# Patient Record
Sex: Female | Born: 1950 | Race: White | Hispanic: No | Marital: Married | Smoking: Former smoker
Health system: Southern US, Community
[De-identification: ages and names within clinical notes are randomized; demographics above are authoritative.]

## PROBLEM LIST (undated history)

## (undated) DIAGNOSIS — R42 Dizziness and giddiness: Secondary | ICD-10-CM

## (undated) DIAGNOSIS — M1990 Unspecified osteoarthritis, unspecified site: Secondary | ICD-10-CM

## (undated) DIAGNOSIS — R0600 Dyspnea, unspecified: Secondary | ICD-10-CM

## (undated) DIAGNOSIS — R0602 Shortness of breath: Secondary | ICD-10-CM

## (undated) DIAGNOSIS — G5601 Carpal tunnel syndrome, right upper limb: Secondary | ICD-10-CM

## (undated) DIAGNOSIS — R51 Headache: Secondary | ICD-10-CM

## (undated) DIAGNOSIS — I499 Cardiac arrhythmia, unspecified: Secondary | ICD-10-CM

## (undated) DIAGNOSIS — R011 Cardiac murmur, unspecified: Secondary | ICD-10-CM

## (undated) DIAGNOSIS — T4145XA Adverse effect of unspecified anesthetic, initial encounter: Secondary | ICD-10-CM

## (undated) DIAGNOSIS — R002 Palpitations: Secondary | ICD-10-CM

## (undated) DIAGNOSIS — T8859XA Other complications of anesthesia, initial encounter: Secondary | ICD-10-CM

## (undated) DIAGNOSIS — K759 Inflammatory liver disease, unspecified: Secondary | ICD-10-CM

## (undated) HISTORY — DX: Palpitations: R00.2

## (undated) HISTORY — DX: Shortness of breath: R06.02

## (undated) HISTORY — DX: Dizziness and giddiness: R42

## (undated) HISTORY — DX: Dyspnea, unspecified: R06.00

## (undated) HISTORY — PX: COLONOSCOPY: SHX174

## (undated) HISTORY — PX: ABDOMINAL HYSTERECTOMY: SHX81

## (undated) HISTORY — DX: Cardiac murmur, unspecified: R01.1

## (undated) HISTORY — PX: NASAL SINUS SURGERY: SHX719

## (undated) HISTORY — PX: PARTIAL HYSTERECTOMY: SHX80

## (undated) HISTORY — PX: BACK SURGERY: SHX140

---

## 1974-12-11 DIAGNOSIS — K759 Inflammatory liver disease, unspecified: Secondary | ICD-10-CM

## 1974-12-11 HISTORY — DX: Inflammatory liver disease, unspecified: K75.9

## 1998-03-11 ENCOUNTER — Other Ambulatory Visit: Admission: RE | Admit: 1998-03-11 | Discharge: 1998-03-11 | Payer: Self-pay | Admitting: *Deleted

## 1998-07-07 ENCOUNTER — Other Ambulatory Visit: Admission: RE | Admit: 1998-07-07 | Discharge: 1998-07-07 | Payer: Self-pay | Admitting: *Deleted

## 1998-10-19 ENCOUNTER — Emergency Department (HOSPITAL_COMMUNITY): Admission: EM | Admit: 1998-10-19 | Discharge: 1998-10-19 | Payer: Self-pay | Admitting: Emergency Medicine

## 1999-05-12 ENCOUNTER — Other Ambulatory Visit: Admission: RE | Admit: 1999-05-12 | Discharge: 1999-05-12 | Payer: Self-pay | Admitting: *Deleted

## 1999-07-14 ENCOUNTER — Encounter (INDEPENDENT_AMBULATORY_CARE_PROVIDER_SITE_OTHER): Payer: Self-pay | Admitting: Specialist

## 1999-07-14 ENCOUNTER — Other Ambulatory Visit: Admission: RE | Admit: 1999-07-14 | Discharge: 1999-07-14 | Payer: Self-pay | Admitting: *Deleted

## 2001-01-28 ENCOUNTER — Other Ambulatory Visit: Admission: RE | Admit: 2001-01-28 | Discharge: 2001-01-28 | Payer: Self-pay | Admitting: *Deleted

## 2001-02-25 ENCOUNTER — Encounter: Payer: Self-pay | Admitting: Orthopedic Surgery

## 2001-02-25 ENCOUNTER — Ambulatory Visit (HOSPITAL_COMMUNITY): Admission: RE | Admit: 2001-02-25 | Discharge: 2001-02-25 | Payer: Self-pay | Admitting: Orthopedic Surgery

## 2001-03-23 ENCOUNTER — Inpatient Hospital Stay (HOSPITAL_COMMUNITY): Admission: EM | Admit: 2001-03-23 | Discharge: 2001-03-25 | Payer: Self-pay | Admitting: *Deleted

## 2002-08-03 ENCOUNTER — Emergency Department (HOSPITAL_COMMUNITY): Admission: EM | Admit: 2002-08-03 | Discharge: 2002-08-03 | Payer: Self-pay | Admitting: Emergency Medicine

## 2002-08-12 ENCOUNTER — Encounter: Payer: Self-pay | Admitting: Neurosurgery

## 2002-08-13 ENCOUNTER — Encounter: Payer: Self-pay | Admitting: Neurosurgery

## 2002-08-13 ENCOUNTER — Ambulatory Visit (HOSPITAL_COMMUNITY): Admission: RE | Admit: 2002-08-13 | Discharge: 2002-08-14 | Payer: Self-pay | Admitting: Neurosurgery

## 2002-12-22 ENCOUNTER — Encounter: Admission: RE | Admit: 2002-12-22 | Discharge: 2003-03-22 | Payer: Self-pay

## 2003-03-19 ENCOUNTER — Encounter: Admission: RE | Admit: 2003-03-19 | Discharge: 2003-06-17 | Payer: Self-pay

## 2005-06-25 ENCOUNTER — Emergency Department (HOSPITAL_COMMUNITY): Admission: EM | Admit: 2005-06-25 | Discharge: 2005-06-25 | Payer: Self-pay | Admitting: *Deleted

## 2005-08-10 ENCOUNTER — Ambulatory Visit (HOSPITAL_COMMUNITY): Admission: RE | Admit: 2005-08-10 | Discharge: 2005-08-10 | Payer: Self-pay | Admitting: Internal Medicine

## 2006-03-02 ENCOUNTER — Encounter
Admission: RE | Admit: 2006-03-02 | Discharge: 2006-03-23 | Payer: Self-pay | Admitting: Physical Medicine and Rehabilitation

## 2006-12-11 HISTORY — PX: ELBOW SURGERY: SHX618

## 2007-01-09 ENCOUNTER — Encounter: Admission: RE | Admit: 2007-01-09 | Discharge: 2007-01-09 | Payer: Self-pay | Admitting: Orthopedic Surgery

## 2007-03-20 ENCOUNTER — Emergency Department (HOSPITAL_COMMUNITY): Admission: EM | Admit: 2007-03-20 | Discharge: 2007-03-20 | Payer: Self-pay | Admitting: Emergency Medicine

## 2007-03-26 ENCOUNTER — Ambulatory Visit: Payer: Self-pay | Admitting: Internal Medicine

## 2008-01-04 ENCOUNTER — Encounter: Admission: RE | Admit: 2008-01-04 | Discharge: 2008-01-04 | Payer: Self-pay | Admitting: Orthopedic Surgery

## 2009-03-30 ENCOUNTER — Encounter: Admission: RE | Admit: 2009-03-30 | Discharge: 2009-03-30 | Payer: Self-pay | Admitting: Orthopedic Surgery

## 2009-08-23 ENCOUNTER — Encounter: Admission: RE | Admit: 2009-08-23 | Discharge: 2009-08-23 | Payer: Self-pay | Admitting: Cardiology

## 2009-10-26 ENCOUNTER — Encounter: Admission: RE | Admit: 2009-10-26 | Discharge: 2009-10-26 | Payer: Self-pay | Admitting: Gastroenterology

## 2010-02-02 ENCOUNTER — Encounter: Admission: RE | Admit: 2010-02-02 | Discharge: 2010-02-02 | Payer: Self-pay | Admitting: Orthopedic Surgery

## 2010-05-31 ENCOUNTER — Encounter: Admission: RE | Admit: 2010-05-31 | Discharge: 2010-05-31 | Payer: Self-pay | Admitting: Gastroenterology

## 2011-01-01 ENCOUNTER — Encounter: Payer: Self-pay | Admitting: Internal Medicine

## 2011-01-01 ENCOUNTER — Encounter: Payer: Self-pay | Admitting: Orthopedic Surgery

## 2011-01-02 ENCOUNTER — Encounter: Payer: Self-pay | Admitting: Orthopedic Surgery

## 2011-01-27 ENCOUNTER — Other Ambulatory Visit: Payer: Self-pay | Admitting: Family Medicine

## 2011-01-27 DIAGNOSIS — M778 Other enthesopathies, not elsewhere classified: Secondary | ICD-10-CM

## 2011-02-03 ENCOUNTER — Ambulatory Visit
Admission: RE | Admit: 2011-02-03 | Discharge: 2011-02-03 | Disposition: A | Discharge status: Home / Self Care | Payer: Medicare Other | Source: Ambulatory Visit | Attending: Family Medicine | Admitting: Family Medicine

## 2011-02-03 DIAGNOSIS — M778 Other enthesopathies, not elsewhere classified: Secondary | ICD-10-CM

## 2011-03-06 ENCOUNTER — Other Ambulatory Visit: Payer: Self-pay | Admitting: *Deleted

## 2011-03-06 DIAGNOSIS — M542 Cervicalgia: Secondary | ICD-10-CM

## 2011-03-07 ENCOUNTER — Other Ambulatory Visit: Payer: Self-pay | Admitting: *Deleted

## 2011-03-07 DIAGNOSIS — M542 Cervicalgia: Secondary | ICD-10-CM

## 2011-03-09 ENCOUNTER — Ambulatory Visit
Admission: RE | Admit: 2011-03-09 | Discharge: 2011-03-09 | Disposition: A | Discharge status: Home / Self Care | Payer: Medicare Other | Source: Ambulatory Visit | Attending: *Deleted | Admitting: *Deleted

## 2011-03-09 DIAGNOSIS — M542 Cervicalgia: Secondary | ICD-10-CM

## 2011-03-24 ENCOUNTER — Encounter: Payer: Self-pay | Admitting: Cardiology

## 2011-03-24 DIAGNOSIS — R011 Cardiac murmur, unspecified: Secondary | ICD-10-CM | POA: Insufficient documentation

## 2011-03-24 DIAGNOSIS — R42 Dizziness and giddiness: Secondary | ICD-10-CM | POA: Insufficient documentation

## 2011-03-24 DIAGNOSIS — R002 Palpitations: Secondary | ICD-10-CM | POA: Insufficient documentation

## 2011-03-24 DIAGNOSIS — R0600 Dyspnea, unspecified: Secondary | ICD-10-CM | POA: Insufficient documentation

## 2011-03-24 DIAGNOSIS — R079 Chest pain, unspecified: Secondary | ICD-10-CM | POA: Insufficient documentation

## 2011-03-27 ENCOUNTER — Ambulatory Visit (INDEPENDENT_AMBULATORY_CARE_PROVIDER_SITE_OTHER): Payer: Medicare Other | Admitting: Cardiology

## 2011-03-27 ENCOUNTER — Encounter: Payer: Self-pay | Admitting: Cardiology

## 2011-03-27 VITALS — BP 130/80 | HR 66 | Wt 161.0 lb

## 2011-03-27 DIAGNOSIS — I499 Cardiac arrhythmia, unspecified: Secondary | ICD-10-CM

## 2011-03-27 DIAGNOSIS — R002 Palpitations: Secondary | ICD-10-CM

## 2011-03-27 DIAGNOSIS — R011 Cardiac murmur, unspecified: Secondary | ICD-10-CM

## 2011-03-27 MED ORDER — METOPROLOL TARTRATE 25 MG PO TABS: 25.0000 mg | ORAL_TABLET | ORAL | Status: DC | PRN

## 2011-03-27 NOTE — Assessment & Plan Note (Signed)
This patient has a history of a heart murmur.  Her last echocardiogram on 08/25/09 showed mild aortic stenosis and no aortic insufficiency.

## 2011-03-27 NOTE — Progress Notes (Signed)
[name] Date of Birth:  Sep 08, 1951 [facility] / [facility] [address]   [address] [place], [place]  [address] [phone]           Fax   [phone]  History of Present Illness: This pleasant 60 year old woman is seen for a work in office visit.  We had not seen her since September 2010.  She comes in because of problems withPalpitations mainly at night.  Worse when she lies on her left side.  He had one episode week ago which awakened her from sleep and lasted about 30 minutes before subsiding he has not been as was any chest pain or shortness of breath she does not have any history of ischemic heart disease and she had a normal stress Cardiolite in 2005.  She has a known heart murmur and her echocardiogram on 08/25/09 and showed mild aortic stenosis.  Current Outpatient Prescriptions  Medication Sig Dispense Refill  . butalbital-acetaminophen-caffeine (FIORICET, ESGIC) 50-325-40 MG per tablet Take 1 tablet by mouth as needed.        . Hormone Gel Base (HRT BASE, MEN,) GEL by Does not apply route. As directed       . TRAMADOL HCL PO Take by mouth as needed.        Marland Kitchen aspirin 81 MG tablet Take 81 mg by mouth daily.        . metoprolol tartrate (LOPRESSOR) 25 MG tablet Take 1 tablet (25 mg total) by mouth as needed (prn palpitations).  30 tablet  11    No Known Allergies  Patient Active Problem List  Diagnoses  . Heart murmur  . Dyspnea  . SOB (shortness of breath)  . Lightheadedness  . Chest pain  . Palpitation    History  Smoking status  . Former Smoker  . Quit date: 04/10/1978  Smokeless tobacco  . Not on file    History  Alcohol Use No    Family History  Problem Relation Age of Onset  . Kidney failure Mother   . Cancer Father     Review of Systems: Constitutional: no fever chills diaphoresis or fatigue or change in weight.  Head and neck: no hearing loss, no epistaxis, no photophobia or visual disturbance. Respiratory: No cough,  shortness of breath or wheezing. Cardiovascular: No chest pain peripheral edema,  Gastrointestinal: No abdominal distention, no abdominal pain, no change in bowel habits hematochezia or melena. Genitourinary: No dysuria, no frequency, no urgency, no nocturia. Musculoskeletal:No arthralgias, no back pain, no gait disturbance or myalgias.She has had recent problems with tendinitis of her elbows as well as problems with cervical spine arthritis. Neurological: No dizziness, no headaches, no numbness, no seizures, no syncope, no weakness, no tremors. Hematologic: No lymphadenopathy, no easy bruising. Psychiatric: No confusion, no hallucinations, no sleep disturbance.    Physical Exam: Filed Vitals:   03/27/11 1503  BP: 130/80  Pulse: 66  Weight 161, down 4 pounds.  The general appearance reveals a well-developed well-nourished woman in no distress.Pupils equal and reactive.   Extraocular Movements are full.  There is no scleral icterus.  The mouth and pharynx are normal.  The neck is supple.  The carotids reveal no bruits.  The jugular venous pressure is normal.  The thyroid is not enlarged.  There is no lymphadenopathy.The chest is clear to percussion and auscultation. There are no rales or rhonchi. Expansion of the chest is symmetrical.The precordium is quiet.  The first heart sound is normal.  The second  heart sound is physiologically split.  There is no  gallop rub or click.There is a grade 2/6 systolic ejection murmur at the base which is present supine sitting and standing.  There is no abnormal lift or heave.The abdomen is soft and nontender. Bowel sounds are normal. The liver and spleen are not enlarged. There Are no abdominal masses. There are no bruits.Normal extremity no phlebitis or edema.  Pedal pulses are good.The skin is warm and dry.  There is no rash.Strength is normal and symmetrical in all extremities.  There is no lateralizing weakness.  There are no sensory deficits.   Assessment /  Plan: Her EKG today is within normal limits.  We are going to have a two-dimensional echocardiogram to evaluate her heart murmur further.  The murmur appears to be more intense than on the last visit.  We're also prescribing Lopressor 25 mg to have on hand for p.r.n. Use for palpitations.  We did not make a return appointment but we will see her p.r.n. And we will be in contact after the echo Is done.

## 2011-03-27 NOTE — Assessment & Plan Note (Signed)
The patient comes in today for evaluation of what she describes as an irregular heartbeat.  This often occurs at night.  She had one episode week ago where she awoke in with a rapid heartbeat which lasted About 30 minutes and she was on the verge of going to the emergency room when it slowed down.  She has difficulty lying on her left side because of dyspnea and increased palpitations.  He has difficulty lying on her right side because of a painful left hip condition.  She does not have any history of ischemic heart disease and she had a normal stress Cardiolite on 03/24/04.  She denies any recent chest pain or pressure.

## 2011-04-03 ENCOUNTER — Ambulatory Visit (HOSPITAL_COMMUNITY): Payer: Medicare Other | Attending: Cardiology

## 2011-04-03 DIAGNOSIS — I319 Disease of pericardium, unspecified: Secondary | ICD-10-CM | POA: Insufficient documentation

## 2011-04-03 DIAGNOSIS — I079 Rheumatic tricuspid valve disease, unspecified: Secondary | ICD-10-CM | POA: Insufficient documentation

## 2011-04-03 DIAGNOSIS — R0989 Other specified symptoms and signs involving the circulatory and respiratory systems: Secondary | ICD-10-CM | POA: Insufficient documentation

## 2011-04-03 DIAGNOSIS — I059 Rheumatic mitral valve disease, unspecified: Secondary | ICD-10-CM | POA: Insufficient documentation

## 2011-04-03 DIAGNOSIS — I379 Nonrheumatic pulmonary valve disorder, unspecified: Secondary | ICD-10-CM | POA: Insufficient documentation

## 2011-04-03 DIAGNOSIS — R0609 Other forms of dyspnea: Secondary | ICD-10-CM | POA: Insufficient documentation

## 2011-04-03 DIAGNOSIS — R002 Palpitations: Secondary | ICD-10-CM

## 2011-04-03 DIAGNOSIS — I499 Cardiac arrhythmia, unspecified: Secondary | ICD-10-CM

## 2011-04-03 DIAGNOSIS — R42 Dizziness and giddiness: Secondary | ICD-10-CM | POA: Insufficient documentation

## 2011-04-05 ENCOUNTER — Telehealth: Payer: Self-pay | Admitting: *Deleted

## 2011-04-05 NOTE — Progress Notes (Signed)
Advised patient of echo  

## 2011-04-05 NOTE — Telephone Encounter (Signed)
Advised patient of echo.  Patient asked about her increased heart rate that had been discussed at office visit.  Asked her if she avoided caffeine and she stated she did not.  Stated she had most problems with increased heart rate at bedtime and that she takes a medication with caffeine in it at bedtime.  Stated she couldn't deal with the headaches, but she could deal with the increased heart rate.  Stated she would just use the medication Dr. Patty Sermons gave her and call back if she needed anything

## 2011-04-05 NOTE — Telephone Encounter (Signed)
Message copied by Regis Bill on Wed Apr 05, 2011 11:21 AM ------      Message from: Cassell Clement      Created: Tue Apr 04, 2011 10:44 AM       Pls report.  Echo is satisfactory. LV is strong. There is very mild aortic thickening which accounts for soft murmur. CSD.  Send copy to her primary MD

## 2011-04-06 ENCOUNTER — Telehealth: Payer: Self-pay | Admitting: *Deleted

## 2011-04-06 NOTE — Telephone Encounter (Signed)
Message copied by Regis Bill on Thu Apr 06, 2011  4:01 PM ------      Message from: Cassell Clement      Created: Tue Apr 04, 2011 10:44 AM       Pls report.  Echo is satisfactory. LV is strong. There is very mild aortic thickening which accounts for soft murmur. CSD.  Send copy to her primary MD

## 2011-04-28 NOTE — Op Note (Signed)
[facility]  Patient:    [name], [name]                    MRN: [id number] Proc. Date: 03/24/01 Adm. Date:  [id number] Attending:  Jacki Cones                           Operative Report  PREOPERATIVE DIAGNOSIS:  L4-5 herniated nucleus pulposus with free fragment and acute radiculopathy.  POSTOPERATIVE DIAGNOSIS:  L4-5 herniated nucleus pulposus with free fragment and acute radiculopathy.  PROCEDURE:  Right L4-5 microdiskectomy ______ .  ANESTHESIA:  General plus Marcaine skin local.  ESTIMATED BLOOD LOSS:  Minimal.  FINDINGS:  Disk herniation with free fragment anterior and lateral out on the right foramina causing nerve root compression.  DESCRIPTION OF PROCEDURE:  After the induction of general anesthesia and oral tracheal intubation, the patient was placed on the Andrews frame with careful padding and positioning.  The back was prepped with DuraPrep.  Postoperative Ancef was given prophylactically.  A midline incision was made after needle localization.  L4-5 level showed that the needle was slightly high. Laminotomy was performed, ligament was incised, removed.  Nerve root was immediately identified with its dorsal and lateral displacement.  Careful blunt dissection was performed, sliding the nerve root over off the fragment which was extruded out of a fibrous pocket that extended from the midline out to the right foramina.  With the DEricco retractor used to carefully protect the nerve root, the fragment was grasped and teased loose.  It was slightly adherent to the outer surface of the floor of the canal.  This was gently teased loose off the bone, and the fragment was removed.  Numerous passes were made, removing some subligamentous disk in the midline.  Up-down pituitaries were used; foraminotomy was performed, and a hockey stick could be taken through 180 degree sweep anterior to the dural with no remaining areas  of fragments.  There was some mild epidural vein bleeding which was coagulated with the bipolar cautery.  Repeat saline irrigation was performed at the operative site and the disk space.  Additional passes were made with the Epstein curette, and all remaining degenerative material was removed. Distally and proximally there were no remaining areas of decompression, and the operative microscope was removed.  The fascia was closed with 0 Vicryl, 2-0 Vicryl in subcutaneous tissue, 4-0 Vicryl subcuticular skin closure. Tincture of Benzoin and Steri-Strips, 4 x 4 and tape.  Instrument count and needle count was correct. DD:  03/24/01 TD:  03/24/01 Job: [id number]

## 2011-04-28 NOTE — Op Note (Signed)
NAMESHAKERIA, ROBINETTE                       ACCOUNT NO.:  [id number]   MEDICAL RECORD NO.:  [id number]                   PATIENT TYPE:  OIB   LOCATION:  3009                                 FACILITY:  [facility]   PHYSICIAN:  Tanya Nones. Jeral Fruit, MD               DATE OF BIRTH:  04-28-1951   DATE OF PROCEDURE:  08/13/2002  DATE OF DISCHARGE:                                 OPERATIVE REPORT   PREOPERATIVE DIAGNOSIS:  Recurrent right L4-5 herniated disk with an L5  radiculopathy.   POSTOPERATIVE DIAGNOSIS:  Recurrent right L4-5 herniated disk with an L5  radiculopathy.   PROCEDURE:  Right L4-5 diskectomy, lysis of adhesions, decompression of the  L4 and L5 nerve root.  Microscope.   SURGEON:  Tanya Nones. Jeral Fruit, MD   ASSISTANT:  Payton Doughty, M.D.   CLINICAL HISTORY:  The patient had surgery a year ago by an orthopedic  surgeon for L4-5 disk.  Now she is coming back with the same problem with  weakness of dorsiflexion of the right foot.  The patient did not want to go  back to see the original surgeon.  The MRI shows herniated disk with a  fragment going to the body of L5.  Surgery was advised, and the patient knew  of the risks.   DESCRIPTION OF PROCEDURE:  The patient was taken to the OR, and she was  positioned in a prone manner.  The back was prepped with Betadine.  We used  the lower part of the previous scar.  An incision was made, resecting the  previous scar.  Then the incision was carried down to the area of 4-5.  We  brought the microscope into the area.  X-rays showed that indeed we were at  the level of 4-5.  The patient had quite a bit of scar tissue at 3-4, and  half an hour was taken to do a lysis of adhesions.  At the end we were able  to visualize the disk space, and we entered with removal of a large amount  of degenerative disk medially and laterally.  Then we continued our lysis of  adhesions until we were able to mobilize the L5 away from the floor of the  spine.  Indeed, there were two large disk fragments that went along with the  fragment with the MRI.  At the end we have a good decompression of not only  the L5 but also the L4 nerve root.  Investigation medially and laterally was  negative.  Having a total gross diskectomy and Valsalva maneuver was  negative, fentanyl and Depo-Medrol were left in the epidural space and the  wound was closed with Vicryl and Steri-Strip.  Tanya Nones. Jeral Fruit, MD    EMB/MEDQ  D:  08/13/2002  T:  08/13/2002  Job:  [id number]

## 2011-04-28 NOTE — Consult Note (Signed)
[name], [name]                       ACCOUNT NO.:  [id number]   MEDICAL RECORD NO.:  [id number]                   PATIENT TYPE:  REC   LOCATION:  TPC                                  FACILITY:  [facility]   PHYSICIAN:  Zachary George, DO                      DATE OF BIRTH:  [birth year]   DATE OF CONSULTATION:  03/09/2003  DATE OF DISCHARGE:                                   CONSULTATION   REASON FOR CONSULTATION:  The patient returns to clinic today for re-  evaluation.  She was last seen on 01/08/03.  The patient states that she was  performing some deep water exercises and noticed increased pain in her lower  back and into her right lower extremity with pain radiating all the way to  her toes.  This is somewhat different then her previous symptoms.  She also  has pain radiating to her groin bilaterally.  Her pain today is an 8/10 on a  subjective scale.  Function and quality of life indices have declined.  Her  sleep is fair.  She continues to take Tylox one per day as needed, and  Bextra b.i.d. as needed.  Occasionally, she will alternate with ibuprofen.  She also has taken Fiorinal in the past for migraine headaches.  She has  been given a trial of Zonegran, but she states it made her crazy.  She  does admit to some urinary hesitancy over the past several months, and feels  like she does not empty her bladder completely.  This is not a new finding,  however.  She also admits to some mild constipation, stating that she has  bowel movements every two to three days.  She denies any saddle anesthesia.  She denies any particular weakness in the lower extremities, with the  exception that she just states that she feels like she has poor endurance.  I reviewed health and history form and 14 point review of systems.   PHYSICAL EXAMINATION:  GENERAL:  A healthy-appearing female in no acute  distress.  VITAL SIGNS:  Blood pressure 122/51, pulse 70, respirations 20,  O2 saturation 97% on  room air.  NEUROLOGIC:  Manual muscle testing is 5/5  bilateral lower extremities in all muscle groups tested today.  Sensory  examination reveals decreased light touch in the right posterior lateral  thigh, lateral calf, dorsum of the right foot, and lateral right toes.  Muscle stretch reflexes are 2+/4 bilateral patellar, 1+/4 left medial  hamstrings and Achilles, and 0/4 right medial hamstrings and Achilles.  Straight leg raise is negative bilaterally.  No abnormal tone in the lower  extremities.   IMPRESSION:  1. Postlaminectomy syndrome, status post right L5 laminectomy with L4-5     diskectomy.  2. Degenerative disk disease of the lumbar spine.  I question whether or not     the patient has had a  new injury at L5-S1 given her physical examination     findings today with S1 distribution radicular symptoms as well.  3. Chronic low back pain with right lower extremity radicular symptoms.  4. Urinary hesitancy, etiology uncertain.  I question whether or not this is     related to the patient's postlaminectomy syndrome.  It is not a new     complaint.   PLAN:  1. I discussed treatment options with the patient at length.  I will start     her on a prednisone taper.  We will start 60 mg daily x2 days, and then     decrease by 10 mg every two days, for a total of 12 days, and     instructions provided.  She is instructed to discontinue Bextra and     ibuprofen while she is on the prednisone taper.  2. We will begin Tylox 5 mg/500 mg one p.o. t.i.d. p.r.n. #50 without     refills.  3. If symptoms are not improving after two weeks of steroids, we will     consider repeat MRI to rule out new injury, especially at the L5-S1 disk     level.  4. The patient is to return to clinic in one month for re-evaluation or     sooner as needed.  I instructed her to monitor her symptoms and if she     develops saddle anesthesia which was described to her and/or worsening     bowel and bladder function  that she should report to the emergency     department or contact Dr. Jeral Fruit, neurosurgeon.   The patient was educated on the above findings and recommendations and  understands.  There were no barriers to communication.                                               Zachary George, DO    JW/MEDQ  D:  03/09/2003  T:  03/09/2003  Job:  [id number]   cc:   Hilda Lias, M.D.  [address]  [place], [place] [address]  Fax: [phone]

## 2011-04-28 NOTE — H&P (Signed)
[facility]  Patient:    [name], [name]                    MRN: [id number] Adm. Date:  [id number] Attending:  Jacki Cones                         History and Physical  HISTORY OF PRESENT ILLNESS:  This 60 year old female was admitted; see handwritten H&P, with onset of severe right lower extremity pain consist with sciatica.  She has had a previous MRI several weeks ago which showed L4-5 bulging disk and epidural steroids recommended.  The patient refused, saw a chiropractor, went through some manipulations, and Saturday morning the patient was on the toilet straining and felt severe, excrutiating low back pain, right leg pain, and was unable to ambulate.  She was brought to the emergency room and despite IM Toradol and IV narcotics of Dilaudid, morphine, and Valium, the patient was unable to stand, unable to ambulate, and was unable to lie down or sit without severe pain.  She is admitted at this time for IV pain medication and repeat MRI scan.  ALLERGIES:  None.  CURRENT MEDICATIONS:  Vicodin for pain, Fiorinal which she has been taking for pain.  PAST MEDICAL HISTORY:  Previous partial hysterectomy.  SOCIAL HISTORY:  The patient is married, is here with her husband and son, and is an Advertising account planner.  FAMILY HISTORY:  Negative for CA, hypertension, coronary artery disease. Father had lung cancer.  REVIEW OF SYSTEMS:  Negative for glasses.  ______ .  RESPIRATORY:  Negative. GU:  Negative other than partial hysterectomy August 2000.  PHYSICAL EXAMINATION:  VITAL SIGNS:  Normal.  HEENT:  Normal.  PERRLA.  EOMI.  TMI.  Pharynx clear.  BREAST:  Not performed.  EXTREMITIES:  The patient has trace CHL in anterior tibia which is 4-/5 with dorsal foot numbness.  Positive straight leg raising at 30 degrees.  The patient screams with foot dorsiflexion.  Negative contralateral straight leg raise, negative reverse straight leg raise.   Opposite leg is normal.  No lymphadenopathy.  ASSESSMENT:  L4-5 herniated nucleus pulposus right with acute radiculopathy.  The patient probably has extruded a fragment at L4-5 and states she is unable to tolerate the pain and will not be able to go home and required admission. Repeat scan and possible operative intervention. DD:  03/24/01 TD:  03/24/01 Job: [id number]

## 2011-04-28 NOTE — Consult Note (Signed)
NAME:  [name], Bender                         ACCOUNT NO.:  [id number]   MEDICAL RECORD NO.:  [id number]                    PATIENT TYPE:   LOCATION:                                       FACILITY:   PHYSICIAN:  Zachary George, DO                      DATE OF BIRTH:  December 23, 1950   DATE OF CONSULTATION:  01/08/2003  DATE OF DISCHARGE:                                   CONSULTATION   HISTORY:  [name] returns to clinic today for re-evaluation.  She was  last seen on December 25, 2002 at which time she underwent bilateral L4-5 and  L5-S1 facet joint injections diagnostically and therapeutically.  The  patient states that she had no relief or improvement following the facet  injections.  She continues to have lower back pain radiating in to her right  lower extremity.  She is status post lumbar laminectomy with L4-5 diskectomy  x2.  She continues on Bextra 20 mg per day but is concerned about financial  issues in regards to the Bextra and her co-payment.  She has a prescription  for ibuprofen 800 mg which she has not filled yet.  She continues on Tylox  and takes this very rarely for severe pain and is not terribly interested in  continuing with narcotic based pain medication.  In addition, she states  that the Tylox makes her oozy.  Her function and quality of life indices  remain somewhat declined.  She states that her pain is well controlled if  she is not very active but once she gets out in the community and starts to  do some things her pain seems to increase.  Her pain today is a 4/10 on a  subjective scale.  Her sleep is great.  [name] Bender and I discussed further  treatment options.  This was an extensive consultation of greater than 25  minutes duration discussing further treatment options.  I reviewed health  and history form and 14 point review of systems.  [name] Bender denies any new  neurologic complaints.   PHYSICAL EXAMINATION:  Blood pressure is 130/65, pulse 78,  respirations 16,  oxygen saturation 98%.  The patient is alert and oriented.  Mood and affect  are appropriate.   IMPRESSION:  1. Chronic low back pain with right lower extremity radicular symptoms.  2. Post laminectomy syndrome, status post right L5 laminectomy with L4-5     diskectomy.  3. Degenerative disk disease of the lumbar spine.   PLAN:  1. [name] Bender and I discussed treatment options at length.  We discussed     medication management versus further minimally invasive interventional     procedures to help control her pain.  In particular, we discussed     nonsteroidal anti-inflammatories and at this time it is reasonable for     her to give ibuprofen 800 mg  three times a day a trial as she is     concerned about the cost of Bextra.  If she does not get any significant     relief with the ibuprofen we will switch back to Bextra.  In terms of     Tylox, it is reasonable for patient to continue using this as a rescue     medication.  She is using the Tylox very sparingly.  We discussed a more     consistent use of opioid medications to help control her pain and she is     not interested in continuing with narcotic based pain medication on a     regular basis.  2. We discussed anticonvulsant medications to help control her radicular     component and at this time we will give her a trial of Zonegran 100 mg     one p.o. daily, #14 sample pack provided.  I discussed the potential side     effect profile.  3. Discussed minimally invasive interventional procedures to help control     her pain.  Would give consideration for lumbar epidural steroid     injections however, she had two epidural steroid injections per Dr.     Murray Hodgkins in the past without any long term benefit.  Would give     consideration for having Ms. Haman evaluated for neuroplasty (Racz     catheter procedure).  4. Patient to return to clinic in one month for re-evaluation.  She is     instructed to contact our  clinic in two weeks to notify us of the effect     of Zonegran.  Would consider increasing the Zonegran to 200 mg daily if     symptoms are not improved.   Patient was educated on the above findings and recommendations and  understands.  There were no barriers to communication.                                               Zachary George, DO    JW/MEDQ  D:  01/08/2003  T:  01/08/2003  Job:  [id number]   cc:   Hilda Lias, M.D.  [address]  [place], [place] [address]  Fax: [phone]

## 2011-04-28 NOTE — Assessment & Plan Note (Signed)
[facility]                             PULMONARY OFFICE NOTE   NAME:JOHNSONCaterra, [name]                    MRN:          [id number]  DATE:03/26/2007                            DOB:          Aug 15, 1951    CHIEF COMPLAINT:  Dyspnea.   HISTORY:  A 60 year old white female, who quit smoking in 1979, has had  trouble with paroxysmal dyspnea over the last 4 weeks subsequent to  having an estrogen ring inserted and learning that dyspnea is one of the  possible side effects.  She had been also trying to exercise and states  that even though she is not exercising now, she is short of breath most  of the time.  There is no real pattern in terms of the dyspnea as far  as day versus night or obvious alleviating or triggering factors.  She  feels no definite improvement on Advair.  She denies any pleuritic or  exertional chest pain or reproducible dyspnea proportionate with  activity.   PAST MEDICAL HISTORY:  Is significant for seasonal rhinitis manifested  by watery discharge spring and fall that has bothered her since her 30s  but states she is actually better recently in this regard.  She has also  had neck surgery, sinus surgery and hysterectomy.   ALLERGIES:  None known.   MEDICATIONS:  Advair 100/50 b.i.d.   SOCIAL HISTORY:  She quit smoking in May 1979 and works as a Research scientist (medical).   FAMILY HISTORY:  Is recorded in detail on the worksheet, negative for  atopy or asthma.   REVIEW OF SYSTEMS:  Also taken on the worksheet, negative as outlined  above.   PHYSICAL EXAMINATION:  This is a slightly anxious, but quite pleasant  ambulatory white female in no acute distress.  VITAL SIGNS:  Stable.  HEENT:  Is remarkable for mild nonspecific turbinate edema.  Oropharynx  is clear.  NECK:  Is supple without cervical adenopathy or tenderness.  Trachea is  midline.  No thyromegaly.  Lung fields perfectly clear bilaterally to auscultation and percussion,  despite the fact that she feels uncomfortable right now with her  breathing.  She did have intermittent sigh breathing.  There is a regular rate and rhythm without any increase in P2.  ABDOMEN:  Soft, benign.  EXTREMITIES:  Warm, without calf tenderness, cyanosis or clubbing.   We ambulated her around the hall, we did 3 laps around the office and  she increased her heart rate to 111 with a saturation in the high 90s.  PFTs were performed today because she was not feeling herself and are  essentially normal.   IMPRESSION:  No evidence of definite asthma.  She also underwent a  workup at [facility] with a negative D-dimer which would be against pulmonary  embolism because to become short of breath with pulmonary embolism, one  has have to have an at least moderate clot burden, which would have been  more obvious both by D-dimer and clinical studies above.   I believe what she has is nothing more than hyperventilation syndrome  and obviously is very anxious about  the possibility of pulmonary  embolism from her estrogen ring.  I have reassured her that this is not  likely the case and simply reassured her at this point that it was very  unlikely that this was asthma.  To be 100% sure about this, the next  step would be a methacholine challenge test, but I first recommended a 2  week course of reflux medicine on reflux diet to reduce the likelihood  of a false positive related to reflux.  I have given her 3 weeks worth  of proton pump inhibitor therapy and scheduled a methacholine challenge  test.  For now I have asked her to stop the Advair.     Charlaine Dalton. Sherene Sires, MD, [facility]  Electronically Signed    MBW/MedQ  DD: 03/26/2007  DT: 03/27/2007  Job #: [id number]   cc:   Marcelino Duster L. Vincente Poli, M.D.

## 2011-04-28 NOTE — Consult Note (Signed)
[name], [name]                       ACCOUNT NO.:  [id number]   MEDICAL RECORD NO.:  [id number]                   PATIENT TYPE:  REC   LOCATION:  [facility]                                  FACILITY:  [facility]   PHYSICIAN:  Zachary George, DO                      DATE OF BIRTH:  [birth year]   DATE OF CONSULTATION:  03/23/2003  DATE OF DISCHARGE:                                   CONSULTATION   HISTORY OF PRESENT ILLNESS:  The patient returns to clinic today for  reevaluation.  She was last seen on 03/09/2003.  She has had an exacerbation  of her lower back and right lower extremity radicular pain.  She is status  post lumbar laminectomy at L5 with L4-5 diskectomy.  Her pain symptoms seem  to be more in an S1 distribution at this point with greater intensity over  the past several weeks compared to previous visits.  She states that she  feels like she did prior to having her surgery on her back.  At last visit,  I started her on a tapering dose of oral prednisone which did not help her  pain.  It did cause some minor side effects including insomnia and burping.  Her pain today is an 8/10 on subjective scale.  She continues to take Tylox  sparingly for her pain as well as Bextra 20 mg daily.  She does request  something to help her sleep at night. She admits to some numbness and  paresthesias in the buttock, posterolateral thigh, calf, and lateral aspect  of her foot and second through fifth toes on her right foot.  I reviewed the  health and history form and 14-point Review of Systems.  No bowel or bladder  dysfunction.   PHYSICAL EXAMINATION:  GENERAL:  Healthy-appearing female in no acute  distress.  VITAL SIGNS:  Blood pressure 134/77, pulse 77, respirations 20, O2  saturation 99% on room air.  NEUROLOGIC:  Manual muscle testing is 5/5 bilateral lower extremities.  Sensory examination reveals decreased light touch in the L5-S1 distribution  in the right lower extremity.  Muscle  stretch reflexes are 2+/4 bilateral  patella, 1+/4 left medial hamstrings and Achilles, and 0/4 right medial  hamstrings and Achilles.  Straight leg raise is negative bilaterally.   IMPRESSION:  1. 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  2. Degenerative disk disease of lumbar spine.  Rule out recurrent disk     herniation versus new disk herniation at L5-S1 given patient's S1     distribution radicular symptoms.  3. Chronic low back pain with right lower extremity radicular symptoms.   PLAN:  1. Given patient's failure to improve with oral prednisone and her increased     intensity of pain symptoms and slight difference in distribution compared     to previous symptoms,  I will go ahead and repeat the MRI of lumbar spine     with and without contrast to rule out recurrent versus new disk     herniation.  I recommend that patient get this MRI performed at [facility] where she had her last MRI for comparison sake.  2. Will begin Pamelor 10 mg 1 to 2 p.o. q.h.s. for sleep restoration, #60     without refills.  3. Continue Tylox and Bextra.  4. The patient is to return to clinic after MRI.  The patient understands     that I will be leaving this clinic in three     days and that she will follow up with the new physicians taking over.     She may need to follow up with Dr. Jeral Fruit at some point.   The patient was educated about findings and recommendations and understands.  No barriers to communication.                                               Zachary George, DO    JW/MEDQ  D:  03/23/2003  T:  03/23/2003  Job:  [id number]   cc:   Hilda Lias, M.D.  [address]  [place], [place] [address]  Fax: [phone]

## 2011-04-28 NOTE — Consult Note (Signed)
[name], [name]                       ACCOUNT NO.:  [id number]   MEDICAL RECORD NO.:  [id number]                   PATIENT TYPE:  REC   LOCATION:  [facility]                                  FACILITY:  [facility]   PHYSICIAN:  Zachary George, DO                      DATE OF BIRTH:  [birth year]   DATE OF CONSULTATION:  12/23/2002  DATE OF DISCHARGE:                                   CONSULTATION   REASON FOR CONSULTATION:  The patient was referred by Dr. Jeral Fruit.  Thank you  very much for kindly referring this patient to the center for pain and  rehabilitative medicine for evaluation.  The patient was evaluated in the  clinic today.  Please refer to those findings for details regarding the  history and physical examination and treatment plan.  Once again, thank you  for allowing [place] to participate in the care of this patient.   CHIEF COMPLAINT:  Low back pain.   HISTORY OF PRESENT ILLNESS:  The patient is a pleasant 60 year-old right  hand dominant female who complains of low back pain with mild pain radiating  into her right lateral thigh and leg.  She describes the low back pain as  greater than her lower extremity radicular symptoms.  She is status post L4-  5 diskectomy per Dr. Ophelia Charter in April 2002.  Following this she had a  recurrent L4-5 disk herniation and underwent an L4-5 diskectomy, lysis of  adhesions and decompression of the right L4 and L5 nerve roots in September  2002 per Dr. Jeral Fruit.  She states that the surgery gave her some improvement  in her lower back pain as well as significant improvement of her right lower  extremity pain.  She continues to complain, however, of low back pain which  fluctuates with activity.  She notes a grabbing or catching sensation in her  lower back.  She has been through extensive aquatic therapy which she states  has strengthened her trunk musculature.  However, she started to have some  pain in her left lower back following the therapy.  She  rates her pain as a  6 over 10 on a subjective scale and describes it as constant, sharp,  tingling and stabbing.  Her symptoms are worse with bending, sitting,  standing and working as well as therapy and improved with rest, heat and  medications which currently include Tylox, Bextra and Valium. She is trying  to wean herself off of Tylox because she states her mother had some type of  drug addiction prior to her death.  She states that she has taken one Tylox  over the past week and takes it only when she cannot stand the pain.  She is  currently also taking Bextra 20 mg daily in addition to Valium 5 mg one half  to one pill b.i.d.  She has tried Ultram  in the past without any relief.  Her function and quality of life indices remain somewhat declined.  Her  sleep is poor.  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  Following her first surgery she underwent some  type of epidural steroid injections per Dr. Murray Hodgkins times two, but I do not  have copies of these reports to review.  She did get some relief at that  time.  She denies any bowel or bladder dysfunction.  She denies fevers,  chills, night sweats or weight loss.  I reviewed her health and history form  and 14 point review of systems.   PAST MEDICAL HISTORY:  1. Chronic headaches treated by her primary care physician with Fiorinal as     needed.   PAST SURGICAL HISTORY:  1. Partial hysterectomy.  2. Lumbar surgery times two as noted.   FAMILY HISTORY:  Cancer.  She also states that her mother was addicted to  pain medication prior to her death.  She did not, however, die of a drug  overdose.   SOCIAL HISTORY:  The patient denies smoking , alcohol or illicit drug use.  She is married and is not currently working for several months  secondary to  her lower back pain.   ALLERGIES:  No known drug allergies.   MEDICATIONS:  1. Tylox sparingly as needed for pain.  2. Bextra 20 mg daily.  3. Valium 5 mg one half to one pill as needed.  4. Fiorinal as needed for headaches as prescribed by her primary care     physician.  5. Prevacid.   PHYSICAL EXAMINATION:  GENERAL:  This is a healthy appearing female in no  acute distress.  VITAL SIGNS:  Blood pressure is 123/64, pulse 79, respirations 18, 02  saturation is 95% on room air.  BACK:  Examination of the back reveals a level pelvis without scoliosis.  There is decreased lumbar lordosis with a small vertical midline incisional  scar which is well healed.  Range of motion of the lumbar spine is full in  all planes with increased pain primarily on extension and extension plus  rotation as well as at end range of flexion.  Extension pain was greater  than flexion pain.  There is mild tenderness to palpation of the bilateral  lumbar paraspinals over the L4-5 and L5-S1 facet regions.  Mini muscle  testing is 5/5 bilateral lower extremities with pain inhibition with left  hip flexion.  Sensory examination is intact to light touch in bilateral  lower extremities.  Muscle stretch reflexes are 2+/4 bilateral patellar and  Achilles, 1+/4 left medial hamstrings and 0/4 right medial hamstrings.  Straight leg raise is equivocal on the right and negative on the left.  Favor test is negative bilaterally.  There is no ankle clonus noted  bilaterally.  No abnormal tone noted in the lower extremities bilaterally.  The patient has mildly tight hip flexors and hamstrings bilaterally.   MRI of the lumbar spine on November 27, 2002 reveals a right L4-5  hemilaminectomy with L4-5 disk bulge and some epidural scarring.   IMPRESSION:  1. Chronic low back pain status post L4-5 diskectomy, lysis of adhesions and     decompressive hemilaminectomy.  The patient's current lower back pain may  be secondary to postoperative changes with degenerative disk disease.     However, she does have some symptoms and physical examination findings     consistent with posterior element pain such as that from lumbar facet     joints.  I suspect that this may be contributing to her current pain     symptoms.   PLAN:  1. I discussed treatment options with the patient to include conservative     management with continuation of therapy, medications and also     interventional procedures to help identify a pain generator and treat her     pain symptoms.  At this point, I would recommend a trial of bilateral L4-     5 and L5-S1 facet joint injections diagnostically and therapeutically.  I     discussed the risks, benefits, limitations and alternatives with the     patient and she wishes to proceed.  If she does not get any considerable     relief with facet joint injections, we would consider repeating a lumbar     epidural steroid injection either by interlaminar or transforaminal     approach.  Her main complaint at this time, however, is low back pain and     I think that the facet joint injections should give her some relief.  2. Continue Tylox as needed for now.  3. Continue Bextra 20 mg daily.  4. The patient is to return to the clinic for diagnostic/therapeutic facet     injections.   The patient was educated on the above findings and recommendations and  understands.  There were no barriers to communication.                                               Zachary George, DO    JW/MEDQ  D:  12/23/2002  T:  12/23/2002  Job:  [id number]   Cc:  Hilda Lias  M.D.

## 2011-04-28 NOTE — Consult Note (Signed)
NAMELORETHA, URE                       ACCOUNT NO.:  [id number]   MEDICAL RECORD NO.:  [id number]                   PATIENT TYPE:  REC   LOCATION:  [facility]                                  FACILITY:  [facility]   PHYSICIAN:  Zachary George, DO                      DATE OF BIRTH:  Feb 08, 1951   DATE OF CONSULTATION:  12/25/2002  DATE OF DISCHARGE:                                   CONSULTATION   HISTORY OF PRESENT ILLNESS:  The patient returns to the clinic today for a  trial of lumbar facet injections diagnostically and therapeutically.  She  was initially seen on December 23, 2002, and continues to complain of low  back pain significantly greater than right lower extremity radicular  symptoms involving her lateral thigh and leg, status post L4-5 diskectomy,  lysis of adhesions, and right L5 laminectomy.  She continues to complain of  significant lower back pain without change in the interim.  Physical  examination on initial visit suggested posterior element of pain.  The  patient returns today for diagnostic/therapeutic facet joint injections.  I  reviewed the health and history form and 14-point review of systems.  No new  neurologic complaints.  The patient's pain today is a 6/10 on a subjective  scale.   PHYSICAL EXAMINATION:  GENERAL:  Healthy female in no acute distress.  VITAL SIGNS:  Blood pressure 109/83, pulse 82, respirations 20, O2  saturations 98% on room air.  BACK:  Palpatory examination reveals significant tenderness to palpation  bilateral lumbar paraspinal muscles with increased pain on extension as well  as extension plus rotation with facet compression test.   IMPRESSION:  1. Chronic low back pain.  I suspect posterior element pain secondary to     facet arthropathy.  2. Degenerative disk disease of the lumbar spine, status post right L5     hemilaminectomy with L4-5 diskectomy x 2.   PLAN:  1. Trial of lumbar facet injections at L4-5 and L5-S1 bilaterally,  diagnostically and therapeutically.  2. Continue current medications including Bextra or ibuprofen and Tylox as     needed for rescue medication.  3. Patient to return to clinic in two weeks for reevaluation and possible     repeat injection as predicated by the patient's response and symptoms.     If the patient is not getting any relief with the above treatment, would     consider lumbar epidural steroid injection.   PROCEDURE:  Bilateral L4-5 and L5-S1 facet joint injections:  The procedure  was described to the patient in detail including risks, benefits,  limitations, and alternatives.  The risks include but are not limited to  bleeding, infection, nerve injury, increased back pain, failure to relieve  pain, allergic reaction to medications.  The patient wishes to proceed.  Informed consent was obtained.  The patient was brought back to the  fluoroscopy suite and placed on the table in prone position.  The skin was  prepped and draped in the usual sterile fashion.  The skin and subcutaneous  tissues were anesthetized with 2 cc of 1% lidocaine at four independent  needle access points.  Under direct fluoroscopic guidance a 22-gauge, 3-1/2-  inch spinal needle was advanced into the inferior recesses of bilateral L5-  S1 facet joints.  Needle tip placement was confirmed after bony contact made  with negative aspiration and injection of 0.2 cc of Omnipaque 180 revealing  appropriate needle placement.  Each joint was then injected with 0.25 cc of  Kenalog 40 mg/cc plus 1.25 cc of preservative-free 1% lidocaine.  Under  direct fluoroscopic guidance the spinal needle was then advanced into the L4-  5 facet joints using independent needle access points.  Needle tip placement  was confirmed after negative aspiration with injection of 0.2 cc of  Omnipaque 180 into each joint revealing appropriate needle placement.  Each  joint was then injected with 0.25 cc of Kenalog 40 mm/cc plus 1.25 cc of   preservative-free 1% lidocaine.  The patient tolerated the procedure well.  Discharge instructions were given.  There were no complications.  The  patient was monitored and released in stable condition.  She notes decreased  pain postinjection.   The patient was educated on the above findings and recommendations and  understands.  There were no barriers to communication.                                               Zachary George, DO    JW/MEDQ  D:  12/25/2002  T:  12/25/2002  Job:  [id number]   cc:   Hilda Lias, M.D.  [address]  [place], [place] [address]  Fax: [phone]

## 2011-04-28 NOTE — H&P (Signed)
[name], [name]                       ACCOUNT NO.:  [id number]   MEDICAL RECORD NO.:  [id number]                   PATIENT TYPE:  OIB   LOCATION:  3009                                 FACILITY:  [facility]   PHYSICIAN:  Advik Weatherspoon DICTATOR                    DATE OF BIRTH:  [birth year]   DATE OF ADMISSION:  08/13/2002  DATE OF DISCHARGE:                                HISTORY & PHYSICAL   HISTORY OF PRESENT ILLNESS:  The patient is a lady who, back in April a year  ago, underwent surgery as emergency for a right L4-5 herniated disk by an  orthopedic surgeon.  The patient did well, went home, but lately has been  having some discomfort in the right leg.  The pain is getting worse.  It  goes from her hip down to the right foot associated with weakness which is  getting worse for the past three months, and she has been unable to walk.  The patient has physical therapy  and conservative treatment without any  improvement.  The patient had an MRI and came for further evaluation.  She  denies any problem with the left leg.   PAST MEDICAL HISTORY:  1. Diskectomy.  2. Partial hysterectomy in the year 2000.   ALLERGIES:  No known drug allergies   SOCIAL HISTORY:  Negative.   FAMILY HISTORY:  Unremarkable.   REVIEW OF SYSTEMS:  The patient has some difficulty with urinary  constipation and problem with back and right leg.   PHYSICAL EXAMINATION:  GENERAL:  The patient came to my office  limping from  the right leg.  When she sat, she needed to lift the right buttock.  HEENT:  Normal.  NECK:  Normal.  LUNGS:  Clear.  CARDIAC:  Heart sounds normal.  ABDOMEN:  Normal.  EXTREMITIES: Normal pulses.  NEUROLOGIC:  Mental status normal.  Cranial nerves normal.  Strength 5/5.  She has posterior L5 weakness of the right foot on dorsiflexion.  Sensation  normal, but she complains of decreased sensation on the top of the right  foot.  Reflexes 1+.  Straight leg raise shows left side is  positive , right  side to 45 degrees.  There is a sciatic notch tenderness on the right side.   LABORATORY DATA:  MRI shows that indeed she has a recurrent disk at L4-5  with herniation of the body of L5.   CLINICAL IMPRESSION:  Right L4-5 herniated disk with probable extension in  body of L5.    RECOMMENDATIONS:  This patient does not want to go back to the orthopedic  surgeon.  She wants [place] to take over her case.  The procedure would be a  exploration of the right L4-5.  The risks were explained such as possibility  of CSF leak, infection, no improvement with surgery, need for further  surgery which might require fusion, injury to  abdomen.                                               Fender Herder DICTATOR    DD/MEDQ  D:  08/13/2002  T:  08/13/2002  Job:  [id number]

## 2011-07-21 ENCOUNTER — Telehealth: Payer: Self-pay | Admitting: Cardiology

## 2011-07-21 NOTE — Telephone Encounter (Signed)
Please call pt back regarding taking medication called Metoprolol, pt is having rapid heart rate, please call pt # [phone]

## 2011-07-21 NOTE — Telephone Encounter (Signed)
We should have her come in for a event monitor to try to determine what her arrhythmia is

## 2011-07-21 NOTE — Telephone Encounter (Signed)
Advised. Will come Friday, will be out of town until then.  May get 30 day or 24-48 hour depending on frequency next week.  Ok per  Dr. Patty Sermons

## 2011-07-21 NOTE — Telephone Encounter (Signed)
Patient is having increased heart rate more frequently.  Is taking Metoprolol 25 mg daily and as needed.  She notices no difference day after taking two tablets and still has the episodes.  She has two cups of coffee in the morning and has started to mix with decaf to decrease her caffeine intake.  Outside of that, no additional caffeine.  She does not know how fast/slow she goes, does not know how to check heart rate. Patient states she can just feel it when it is going fast and has even started waking up with it like this.  Please advise

## 2011-07-28 ENCOUNTER — Ambulatory Visit (INDEPENDENT_AMBULATORY_CARE_PROVIDER_SITE_OTHER): Payer: Medicare Other | Admitting: *Deleted

## 2011-07-29 DIAGNOSIS — R002 Palpitations: Secondary | ICD-10-CM

## 2011-08-04 ENCOUNTER — Telehealth: Payer: Self-pay | Admitting: *Deleted

## 2011-08-04 NOTE — Telephone Encounter (Signed)
Advised patient of monitor results  

## 2011-08-07 ENCOUNTER — Encounter: Payer: Self-pay | Admitting: Cardiology

## 2011-08-21 ENCOUNTER — Encounter: Payer: Self-pay | Admitting: Cardiology

## 2011-09-11 ENCOUNTER — Telehealth: Payer: Self-pay | Admitting: Cardiology

## 2011-09-11 NOTE — Telephone Encounter (Signed)
Patient wants to speak only with you. Did offer an appointment with Lawson Fiscal and she doesn't want that.  She wants to know why this heart rate is happening and what is going.  Wants for you to call her after 12 tomorrow.  Will discuss in am

## 2011-09-11 NOTE — Telephone Encounter (Signed)
Pt calling stating that she is concerned about HR and wants to talk to MD. Please return pt call to discuss further.

## 2011-09-11 NOTE — Telephone Encounter (Signed)
OK discuss tomorrow

## 2011-09-12 ENCOUNTER — Telehealth: Payer: Self-pay | Admitting: Cardiology

## 2011-09-12 ENCOUNTER — Telehealth: Payer: Self-pay | Admitting: *Deleted

## 2011-09-12 NOTE — Telephone Encounter (Signed)
Dr. Patty Sermons will call patient

## 2011-09-12 NOTE — Telephone Encounter (Signed)
Dr. Patty Sermons spoke with patient regarding her heart pounding.  Will increase her Lopressor to 50 mg in am and 25-50 mg in pm as needed

## 2011-09-22 ENCOUNTER — Emergency Department (HOSPITAL_COMMUNITY)
Admission: EM | Admit: 2011-09-22 | Discharge: 2011-09-22 | Discharge status: Against Medical Advice | Payer: Medicare Other | Attending: Emergency Medicine | Admitting: Emergency Medicine

## 2011-09-22 DIAGNOSIS — M79609 Pain in unspecified limb: Secondary | ICD-10-CM | POA: Insufficient documentation

## 2011-09-28 ENCOUNTER — Other Ambulatory Visit: Payer: Self-pay | Admitting: *Deleted

## 2011-09-29 ENCOUNTER — Other Ambulatory Visit: Payer: Self-pay | Admitting: *Deleted

## 2012-03-13 NOTE — Telephone Encounter (Signed)
Close  

## 2012-03-18 ENCOUNTER — Other Ambulatory Visit: Payer: Self-pay | Admitting: Cardiology

## 2012-03-18 NOTE — Telephone Encounter (Signed)
Refilled metoprolol 

## 2012-03-25 ENCOUNTER — Encounter: Payer: Self-pay | Admitting: Cardiology

## 2012-03-25 ENCOUNTER — Ambulatory Visit (INDEPENDENT_AMBULATORY_CARE_PROVIDER_SITE_OTHER): Payer: Medicare Other | Admitting: Cardiology

## 2012-03-25 VITALS — BP 118/80 | HR 78 | Ht 70.0 in | Wt 157.0 lb

## 2012-03-25 DIAGNOSIS — R002 Palpitations: Secondary | ICD-10-CM

## 2012-03-25 DIAGNOSIS — M1990 Unspecified osteoarthritis, unspecified site: Secondary | ICD-10-CM | POA: Insufficient documentation

## 2012-03-25 DIAGNOSIS — R011 Cardiac murmur, unspecified: Secondary | ICD-10-CM

## 2012-03-25 DIAGNOSIS — R0600 Dyspnea, unspecified: Secondary | ICD-10-CM

## 2012-03-25 DIAGNOSIS — R9431 Abnormal electrocardiogram [ECG] [EKG]: Secondary | ICD-10-CM

## 2012-03-25 DIAGNOSIS — R0989 Other specified symptoms and signs involving the circulatory and respiratory systems: Secondary | ICD-10-CM

## 2012-03-25 NOTE — Assessment & Plan Note (Signed)
The patient has had a workup of her right hip pain at [facility].  She had a trial of injections which were of no help.  She now hopes to get an appointment to see an orthopedist here in [place], Dr. Lequita Halt.

## 2012-03-25 NOTE — Patient Instructions (Signed)
Your physician has requested that you have an exercise stress myoview. For further information please visit [email or web address] Please follow instruction sheet, as given.  Your physician wants you to follow-up in: 6 months with Dr. Patty Sermons.  You will receive a reminder letter in the mail two months in advance. If you don't receive a letter, please call our office to schedule the follow-up appointment.

## 2012-03-25 NOTE — Assessment & Plan Note (Signed)
The patient notes forceful heartbeat when she tries to lie on her left side.  She is having difficulty sleeping because her heart prevents her from lying on her left side and she has a with her right hip which causes her pain if she lies on her right side.

## 2012-03-25 NOTE — Assessment & Plan Note (Signed)
The patient has a history of a soft systolic murmur at the base.  This recent echocardiogram 04/03/11 showed mild aortic stenosis and normal left ventricular systolic function

## 2012-03-25 NOTE — Assessment & Plan Note (Signed)
The patient has had some dyspnea which may be secondary to lack of regular intentional walking exercise.  We did do an EKG today which shows anterior wall T-wave inversions which are more pronounced than they were on 2011/04/22.  The computer raises question of anterior ischemia although the T-wave abnormality in his right eye secondary to her incomplete right bundle branch block.

## 2012-03-25 NOTE — Progress Notes (Signed)
[name] Date of Birth:  1951/07/04 [facility] [address] [place], [place]  [address] [phone]         Fax   [phone]  History of Present Illness: This pleasant 61 year old female is seen for followup of her cardiac situation.  She is very concerned about her heart.  She notes that she is unable to sleep on her left side.  She is concerned that if she tries to sleep on her left side her heart rate accelerates almost immediately.  She is also aware of palpitations and a forceful heartbeat.  Because of these concerns we did do a 24-hour Holter monitor on her last year which did not show any unusual findings and she did have just occasional benign PVCs.  She does have a history of mild aortic valve disease with mild aortic stenosis noted in 2010 and again by echocardiogram on 04/03/11.  Over the winter she has been less physically active.  She wants to get back into a good walking program again.  Despite her inactivity, her weight is actually down 4 pounds since last visit.  Current Outpatient Prescriptions  Medication Sig Dispense Refill  . ALPRAZolam (XANAX) 0.5 MG tablet Take 0.5 mg by mouth at bedtime as needed.      . butalbital-acetaminophen-caffeine (FIORICET, ESGIC) 50-325-40 MG per tablet Take 1 tablet by mouth as needed.        . Hormone Gel Base (HRT BASE, MEN,) GEL by Does not apply route. As directed       . metoprolol (LOPRESSOR) 50 MG tablet Take 50 mg by mouth daily.       . TRAMADOL HCL PO Take by mouth as needed.        Marland Kitchen DISCONTD: metoprolol tartrate (LOPRESSOR) 25 MG tablet Take 1 tablet (25 mg total) by mouth as needed (prn palpitations).  30 tablet  11  . DISCONTD: metoprolol tartrate (LOPRESSOR) 25 MG tablet take 1 tablet by mouth once daily if needed for PALPITATION  30 tablet  1    No Known Allergies  Patient Active Problem List  Diagnoses  . Heart murmur  . Dyspnea  . SOB (shortness of breath)  . Lightheadedness  . Chest  pain  . Palpitation    History  Smoking status  . Former Smoker  . Quit date: 04/10/1978  Smokeless tobacco  . Not on file    History  Alcohol Use No    Family History  Problem Relation Age of Onset  . Kidney failure Mother   . Cancer Father     Review of Systems: Constitutional: no fever chills diaphoresis or fatigue or change in weight.  Head and neck: no hearing loss, no epistaxis, no photophobia or visual disturbance. Respiratory: No cough, shortness of breath or wheezing. Cardiovascular: No chest pain peripheral edema, palpitations. Gastrointestinal: No abdominal distention, no abdominal pain, no change in bowel habits hematochezia or melena. Genitourinary: No dysuria, no frequency, no urgency, no nocturia. Musculoskeletal:No arthralgias, no back pain, no gait disturbance or myalgias. Neurological: No dizziness, no headaches, no numbness, no seizures, no syncope, no weakness, no tremors. Hematologic: No lymphadenopathy, no easy bruising. Psychiatric: No confusion, no hallucinations, no sleep disturbance.    Physical Exam: Filed Vitals:   03/25/12 1516  BP: 118/80  Pulse: 78   General appearance reveals a somewhat anxious well-developed well-nourished middle-age woman in no distress.  She is concerned about her heart and her symptoms.Pupils equal and reactive.   Extraocular Movements are  full.  There is no scleral icterus.  The mouth and pharynx are normal.  The neck is supple.  The carotids reveal no bruits.  The jugular venous pressure is normal.  The thyroid is not enlarged.  There is no lymphadenopathy.  The chest is clear to percussion and auscultation. There are no rales or rhonchi. Expansion of the chest is symmetrical.  Heart reveals a soft systolic ejection murmur at the base.  No diastolic murmur.  No gallop or rub.The abdomen is soft and nontender. Bowel sounds are normal. The liver and spleen are not enlarged. There Are no abdominal masses. There are no  bruits.  The pedal pulses are good.  There is no phlebitis or edema.  There is no cyanosis or clubbing. Strength is normal and symmetrical in all extremities.  There is no lateralizing weakness.  There are no sensory deficits.  EKG shows normal sinus rhythm and increased anterior T-wave inversion V1 through V3.  Assessment / Plan: We will have the patient return for a treadmill Myoview stress test to evaluate her symptoms further.  She did not think that her right hip discomfort would prevent her from exercising adequately on the treadmill.  If she does develop pain in her hip we can convert to a Lexi- scan. Continue on present medication.  Recheck for regular followup visit 6 months

## 2012-04-03 ENCOUNTER — Ambulatory Visit (HOSPITAL_COMMUNITY): Payer: Medicare Other | Attending: Cardiovascular Disease | Admitting: Radiology

## 2012-04-03 VITALS — BP 129/80 | Ht 70.0 in | Wt 155.0 lb

## 2012-04-03 DIAGNOSIS — R0602 Shortness of breath: Secondary | ICD-10-CM

## 2012-04-03 DIAGNOSIS — Z87891 Personal history of nicotine dependence: Secondary | ICD-10-CM | POA: Insufficient documentation

## 2012-04-03 DIAGNOSIS — R9431 Abnormal electrocardiogram [ECG] [EKG]: Secondary | ICD-10-CM

## 2012-04-03 DIAGNOSIS — R0609 Other forms of dyspnea: Secondary | ICD-10-CM | POA: Insufficient documentation

## 2012-04-03 DIAGNOSIS — R42 Dizziness and giddiness: Secondary | ICD-10-CM | POA: Insufficient documentation

## 2012-04-03 DIAGNOSIS — R0989 Other specified symptoms and signs involving the circulatory and respiratory systems: Secondary | ICD-10-CM | POA: Insufficient documentation

## 2012-04-03 DIAGNOSIS — I4949 Other premature depolarization: Secondary | ICD-10-CM

## 2012-04-03 DIAGNOSIS — R002 Palpitations: Secondary | ICD-10-CM | POA: Insufficient documentation

## 2012-04-03 DIAGNOSIS — R000 Tachycardia, unspecified: Secondary | ICD-10-CM | POA: Insufficient documentation

## 2012-04-03 DIAGNOSIS — R079 Chest pain, unspecified: Secondary | ICD-10-CM

## 2012-04-03 MED ORDER — TECHNETIUM TC 99M TETROFOSMIN IV KIT
11.0000 | PACK | Freq: Once | INTRAVENOUS | Status: AC | PRN
  Administered 2012-04-03: 11 via INTRAVENOUS

## 2012-04-03 MED ORDER — TECHNETIUM TC 99M TETROFOSMIN IV KIT
33.0000 | PACK | Freq: Once | INTRAVENOUS | Status: AC | PRN
  Administered 2012-04-03: 33 via INTRAVENOUS

## 2012-04-03 NOTE — Progress Notes (Signed)
[facility] SITE 3 NUCLEAR MED [address] [place] [address] [phone]  Cardiology Nuclear Med Study  [name] is a 61 y.o. female     MRN : [id number]     DOB: February 07, 1951  Procedure Date: 04/03/2012  Nuclear Med Background Indication for Stress Test:  Evaluation for Ischemia and Abnormal EKG History:  '05 OZH:YQMVHQ, EF=67%; 4/12 Echo:EF=55-65%, mild AS Cardiac Risk Factors: History of Smoking  Symptoms:  DOE, Light-Headedness, Palpitations and Rapid HR   Nuclear Pre-Procedure Caffeine/Decaff Intake:  None NPO After: 7:30pm   Lungs:  clear O2 Sat: 98% on room air. IV 0.9% NS with Angio Cath:  22g  IV Site: R Hand  IV Started by:  Doyne Keel, CNMT  Chest Size (in):  36 Cup Size: D  Height: 5\' 10"  (1.778 m)  Weight:  155 lb (70.308 kg)  BMI:  Body mass index is 22.24 kg/(m^2). Tech Comments:  Metoprolol held for 36 hours/fioricet held for 12 hours    Nuclear Med Study 1 or 2 day study: 1 day  Stress Test Type:  Stress  Reading MD: Charlton Haws, MD  Order Authorizing Provider:  Wylene Simmer, MD  Resting Radionuclide: Technetium 23m Tetrofosmin  Resting Radionuclide Dose: 11.0 mCi   Stress Radionuclide:  Technetium 12m Tetrofosmin  Stress Radionuclide Dose: 33.0 mCi           Stress Protocol Rest HR: 59 Stress HR: 150  Rest BP: Sitting 129/80  Standing 137/82 Stress BP: 200/72  Exercise Time (min): 8:30 METS: 10.1   Predicted Max HR: 162 bpm % Max HR: 92.59 bpm Rate Pressure Product: 46962   Dose of Adenosine (mg):  n/a Dose of Lexiscan: n/a mg  Dose of Atropine (mg): n/a Dose of Dobutamine: n/a mcg/kg/min (at max HR)  Stress Test Technologist: Smiley Houseman, CMA-N  Nuclear Technologist:  Domenic Polite, CNMT     Rest Procedure:  Myocardial perfusion imaging was performed at rest 45 minutes following the intravenous administration of Technetium 46m Tetrofosmin. Rest ECG: No acute changes  Stress Procedure:  The patient  exercised on the treadmill utilizing the Bruce  Protocol for 8:30 minutes. She then stopped due to fatigue.  She did c/o nonspecific chest pressure with exercise.  There were no diagnostic ST-T wave changes.  T here were occasional PVC's and PAC's and she had very nonspecific ST-T wave changes.  She also had a hypertensive response to exercise, 200/72. Technetium 59m Tetrofosmin was injected at peak exercise and myocardial perfusion imaging was performed after a brief delay. Stress ECG: No significant change from baseline ECG  QPS Raw Data Images:  Normal; no motion artifact; normal heart/lung ratio. Stress Images:  Normal homogeneous uptake in all areas of the myocardium. Rest Images:  Normal homogeneous uptake in all areas of the myocardium. Subtraction (SDS):  No evidence of ischemia. Transient Ischemic Dilatation (Normal <1.22):  0.89 Lung/Heart Ratio (Normal <0.45):  0.30  Quantitative Gated Spect Images QGS EDV:  72 ml QGS ESV:  17 ml  Impression Exercise Capacity:  Good exercise capacity. BP Response:  Hypertensive blood pressure response. Clinical Symptoms:  No significant symptoms noted. ECG Impression:  No significant ST segment change suggestive of ischemia. Comparison with Prior Nuclear Study: No significant change from previous study  Overall Impression:  Normal stress nuclear study.  No evidence of ischemia.   LV Ejection Fraction: 76%.  LV Wall Motion:  NL LV Function; NL Wall Motion    [name], [name].,  MD, [facility] 04/04/2012, 8:17 AM

## 2012-04-04 ENCOUNTER — Telehealth: Payer: Self-pay | Admitting: *Deleted

## 2012-04-04 NOTE — Telephone Encounter (Signed)
Message copied by Burnell Blanks on Thu Apr 04, 2012  4:36 PM ------      Message from: Cassell Clement      Created: Thu Apr 04, 2012  3:23 PM       Please report.  The stress test was normal.  The left ventricular function was normal.  There was no evidence of blockage.  Continue same medications.

## 2012-04-04 NOTE — Telephone Encounter (Signed)
Advised of labs 

## 2012-05-13 ENCOUNTER — Other Ambulatory Visit: Payer: Self-pay | Admitting: Cardiology

## 2012-06-27 ENCOUNTER — Other Ambulatory Visit: Payer: Self-pay | Admitting: Cardiology

## 2012-06-27 NOTE — Telephone Encounter (Signed)
Refilled metoprolol 

## 2012-07-16 ENCOUNTER — Other Ambulatory Visit: Payer: Self-pay | Admitting: Cardiology

## 2012-07-31 ENCOUNTER — Telehealth: Payer: Self-pay | Admitting: Cardiology

## 2012-07-31 NOTE — Telephone Encounter (Signed)
New problem:  Need to changed direction of the metoprolol due to continue usage.

## 2012-07-31 NOTE — Telephone Encounter (Signed)
Left message to call back  

## 2012-07-31 NOTE — Telephone Encounter (Signed)
Patient states she takes Metoprolol 25 mg daily and as needed (not more than 2 a day).  Wants new Rx sent to pharmacy.  Will forward to  Dr. Patty Sermons for review

## 2012-07-31 NOTE — Telephone Encounter (Signed)
Change to 25 mg BID PRN

## 2012-08-01 MED ORDER — METOPROLOL TARTRATE 25 MG PO TABS: 25.0000 mg | ORAL_TABLET | Freq: Two times a day (BID) | ORAL | Status: DC | PRN

## 2012-08-01 NOTE — Telephone Encounter (Signed)
Sent to pharmacy 

## 2012-10-02 ENCOUNTER — Encounter: Payer: Self-pay | Admitting: Cardiology

## 2012-10-02 ENCOUNTER — Ambulatory Visit (INDEPENDENT_AMBULATORY_CARE_PROVIDER_SITE_OTHER): Payer: Medicare Other | Admitting: Cardiology

## 2012-10-02 VITALS — BP 130/68 | HR 68 | Ht 70.0 in | Wt 158.1 lb

## 2012-10-02 DIAGNOSIS — R002 Palpitations: Secondary | ICD-10-CM

## 2012-10-02 DIAGNOSIS — R011 Cardiac murmur, unspecified: Secondary | ICD-10-CM

## 2012-10-02 NOTE — Progress Notes (Signed)
[name] Date of Birth:  04/15/1951 [facility] [address] [place], [place]  [address] [phone]  Fax   [phone]  HPI: This pleasant 61 year old female is seen for followup of her cardiac situation. She is very concerned about her heart. She notes that she is unable to sleep on her left side. She is concerned that if she tries to sleep on her left side her heart rate accelerates almost immediately. She is also aware of palpitations and a forceful heartbeat.  She is unable to sleep on her right side because of right hip pain. Because of these concerns we did do a 24-hour Holter monitor on her last year which did not show any unusual findings and she did have just occasional benign PVCs. She does have a history of mild aortic valve disease with mild aortic stenosis noted in 2010 and again by echocardiogram on 04/03/11. Over the winter she has been less physically active. She wants to get back into a good walking program again. Despite her inactivity, her weight is actually down 4 pounds since last visit.  We saw her 6 months ago and there was a slight change in her EKG and we proceeded with a nuclear stress test on 04/04/12 which was normal.   Current Outpatient Prescriptions  Medication Sig Dispense Refill  . ALPRAZolam (XANAX) 0.5 MG tablet Take 0.5 mg by mouth at bedtime as needed.      . butalbital-acetaminophen-caffeine (FIORICET, ESGIC) 50-325-40 MG per tablet Take 1 tablet by mouth as needed.        Marland Kitchen CALCIUM PO Take by mouth 2 (two) times daily.      Marland Kitchen Hormone Gel Base (HRT BASE, MEN,) GEL by Does not apply route. Progesterone and divigel As directed      . metoprolol tartrate (LOPRESSOR) 25 MG tablet Take 1 tablet (25 mg total) by mouth 2 (two) times daily as needed.  60 tablet  11  . Multiple Vitamin (MULTIVITAMIN) tablet Take 1 tablet by mouth 2 (two) times daily.      . Probiotic Product (PROBIOTIC DAILY PO) Take by mouth 2 (two) times daily.        . TRAMADOL HCL PO Take by mouth as needed.          No Known Allergies  Patient Active Problem List  Diagnosis  . Heart murmur  . Dyspnea  . SOB (shortness of breath)  . Lightheadedness  . Chest pain  . Palpitation  . Osteoarthritis    History  Smoking status  . Former Smoker  . Quit date: 04/10/1978  Smokeless tobacco  . Not on file    History  Alcohol Use No    Family History  Problem Relation Age of Onset  . Kidney failure Mother   . Cancer Father     Review of Systems: The patient denies any heat or cold intolerance.  No weight gain or weight loss.  The patient denies headaches or blurry vision.  There is no cough or sputum production.  The patient denies dizziness.  There is no hematuria or hematochezia.  The patient denies any muscle aches or arthritis.  The patient denies any rash.  The patient denies frequent falling or instability.  There is no history of depression or anxiety.  All other systems were reviewed and are negative.   Physical Exam: Filed Vitals:   10/02/12 1019  BP: 130/68  Pulse: 68   the general appearance reveals a well-developed well-nourished healthy-appearing woman  in no distress.The head and neck exam reveals pupils equal and reactive.  Extraocular movements are full.  There is no scleral icterus.  The mouth and pharynx are normal.  The neck is supple.  The carotids reveal no bruits.  The jugular venous pressure is normal.  The  thyroid is not enlarged.  There is no lymphadenopathy.  The chest is clear to percussion and auscultation.  There are no rales or rhonchi.  Expansion of the chest is symmetrical.  The precordium is quiet.  The first heart sound is normal.  The second heart sound is physiologically split.  There is no  gallop rub or click.  There is a grade 2/6 systolic ejection murmur at the base There is no abnormal lift or heave.  The abdomen is soft and nontender.  The bowel sounds are normal.  The liver and spleen are not enlarged.   There are no abdominal masses.  There are no abdominal bruits.  Extremities reveal good pedal pulses.  There is no phlebitis or edema.  There is no cyanosis or clubbing.  Strength is normal and symmetrical in all extremities.  There is no lateralizing weakness.  There are no sensory deficits.  The skin is warm and dry.  There is no rash.     Assessment / Plan: Continue same medication.  Return soon for a 2-D echo.  Try to avoid caffeine even at night in her Fioricet

## 2012-10-02 NOTE — Assessment & Plan Note (Signed)
The patient takes Xanax and also takes Fioricet at bedtime in order to prevent early morning migraines.  The Fioricet contains caffeine.  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  She acknowledges that she seems to be very sensitive to adrenaline.  Also when she has taken steroids in the past for orthopedic conditions she has noticed a tendency toward fast heart rate and palpitations

## 2012-10-02 NOTE — Patient Instructions (Addendum)
Your physician recommends that you continue on your current medications as directed. Please refer to the Current Medication list given to you today.  Your physician wants you to follow-up in: 6 month ov You will receive a reminder letter in the mail two months in advance. If you don't receive a letter, please call our office to schedule the follow-up appointment.   Your physician has requested that you have an echocardiogram. Echocardiography is a painless test that uses sound waves to create images of your heart. It provides your doctor with information about the size and shape of your heart and how well your heart's chambers and valves are working. This procedure takes approximately one hour. There are no restrictions for this procedure.   

## 2012-10-02 NOTE — Assessment & Plan Note (Signed)
She has a soft systolic ejection murmur at the base suggestive of aortic stenosis.  We will have her return for an echocardiogram to evaluate further.

## 2012-10-09 ENCOUNTER — Ambulatory Visit (HOSPITAL_COMMUNITY): Payer: Medicare Other | Attending: Cardiology | Admitting: Radiology

## 2012-10-09 DIAGNOSIS — R011 Cardiac murmur, unspecified: Secondary | ICD-10-CM | POA: Insufficient documentation

## 2012-10-09 DIAGNOSIS — I319 Disease of pericardium, unspecified: Secondary | ICD-10-CM | POA: Insufficient documentation

## 2012-10-09 DIAGNOSIS — I369 Nonrheumatic tricuspid valve disorder, unspecified: Secondary | ICD-10-CM | POA: Insufficient documentation

## 2012-10-09 DIAGNOSIS — I359 Nonrheumatic aortic valve disorder, unspecified: Secondary | ICD-10-CM | POA: Insufficient documentation

## 2012-10-09 NOTE — Progress Notes (Signed)
Echocardiogram performed.  

## 2012-10-11 ENCOUNTER — Telehealth: Payer: Self-pay | Admitting: Cardiology

## 2012-10-11 MED ORDER — METOPROLOL SUCCINATE ER 50 MG PO TB24: 50.0000 mg | ORAL_TABLET | Freq: Every day | ORAL | Status: DC

## 2012-10-11 NOTE — Telephone Encounter (Signed)
Advised patient of results and medication changes  

## 2012-10-11 NOTE — Telephone Encounter (Signed)
Message copied by Burnell Blanks on Fri Oct 11, 2012  3:41 PM ------      Message from: Cassell Clement      Created: Thu Oct 10, 2012  5:31 PM       Please report.  The echo shows that her ejection fraction is 65-70% so very vigorous.  This may be causing the symptoms she experiences in the early morning hours.  I want her to stop the metoprolol tartrate and switch to Toprol XL 50 mg each night at supper.      The aortic valve is slightly thickened and this is what causes the murmur I hear.

## 2012-10-11 NOTE — Telephone Encounter (Signed)
New Problem: ° ° ° °Patient returned your call.  Please call back. °

## 2012-10-14 ENCOUNTER — Telehealth: Payer: Self-pay | Admitting: Cardiology

## 2012-10-14 NOTE — Telephone Encounter (Signed)
Pt rtn your call

## 2012-10-14 NOTE — Telephone Encounter (Signed)
Pt has questions regarding her medications.

## 2012-10-14 NOTE — Telephone Encounter (Signed)
Left message to call back  

## 2012-10-15 MED ORDER — DILTIAZEM HCL ER COATED BEADS 120 MG PO CP24: 120.0000 mg | ORAL_CAPSULE | Freq: Every day | ORAL | Status: DC

## 2012-10-15 NOTE — Telephone Encounter (Signed)
Follow-up:    Patient returned your call.  Please call back. 

## 2012-10-15 NOTE — Telephone Encounter (Signed)
Very strange reaction Torpol XL 50 mg first night she took it she felt clammy, lightheaded, heart rate felt strong, and felt like she had indigestion.  Patient wants to know if there is something else she can take. Will forward to  Dr. Patty Sermons for review

## 2012-10-15 NOTE — Telephone Encounter (Signed)
Stop Toprol.  Start diltiazem CD 120 mg one daily at supper.

## 2012-10-15 NOTE — Telephone Encounter (Signed)
Advised patient and called to pharmacy.  

## 2012-10-18 ENCOUNTER — Telehealth: Payer: Self-pay | Admitting: Cardiology

## 2012-10-18 NOTE — Telephone Encounter (Signed)
Patient states MD started her on  Diltiazem 120 mg once  every evening two days ago. Yesterday she started having side effects from medication. Pt states feels  like she is not getting enough air in her long  With each breath. Yesterday she felt lightheaded when walking, Pt feels her heart is beating strong and is uncomfortable . Pt's Heart rate now is 80 beats/minute. Pt would like to try some other medication.

## 2012-10-18 NOTE — Telephone Encounter (Signed)
plz return call to pt at cell# [phone] regarding diltiazem (CARDIZEM CD) 120 MG 24 hr capsule, pt thinks she may be having side affects from meds, SOB, light headed, no chest pain at this time.

## 2012-10-18 NOTE — Telephone Encounter (Signed)
Stop taking the Cardizem CD.  For now we will not add any additional heart medication since all of her tests have been good.  I would recommend that she use her alprazolam which she has on hand on a when necessary basis and see if this helps the heart pounding symptoms.

## 2012-10-21 NOTE — Telephone Encounter (Signed)
Discussed with patient and she stated that her symptoms happen after eating.  On Saturday night she had no medication but after eating started with the symptoms and she took a Metoprolol and in about 30 minutes they resolved.  Patient is concerned about the side effects of these medications. Discussed with  Dr. Patty Sermons and will have the patient d/c Cardizem and just use the Metoprolol as needed.  Also advised to follow up with PCP since this seems to be correlated to when she eats, may need GI referral. Patient verbalized understanding.

## 2012-10-21 NOTE — Telephone Encounter (Signed)
Follow-up:    Patient called in wanting to follow-up on her previous calls about her medications.  Please call back.

## 2012-10-25 ENCOUNTER — Telehealth: Payer: Self-pay | Admitting: Cardiology

## 2012-10-25 NOTE — Telephone Encounter (Signed)
Pt needs to talk with you again,  pls call [phone]

## 2012-10-25 NOTE — Telephone Encounter (Signed)
Patient saw Dr Kinnie Scales today and he did give her new Rx today to see if it helped with her symptoms she had been having after eating.  Patient is concerned some of her problems may be coming from the beta blocker she has been taking.  Discussed with  Dr. Patty Sermons and it is ok for her to stop for now and to call back if symptoms persist

## 2012-10-29 ENCOUNTER — Telehealth: Payer: Self-pay | Admitting: Cardiology

## 2012-10-29 MED ORDER — METOPROLOL TARTRATE 25 MG PO TABS: 25.0000 mg | ORAL_TABLET | ORAL | Status: DC | PRN

## 2012-10-29 NOTE — Telephone Encounter (Signed)
I am glad that seems to be helping her.

## 2012-10-29 NOTE — Telephone Encounter (Signed)
Spoke with patient and she had an episode where she felt her heart pounding and she used Metoprolol 25 mg and it seemed to calm down.  Refilled and she will continue to use as needed

## 2012-10-29 NOTE — Telephone Encounter (Signed)
Metoprolol refill needed at [facility], said only need to call if any problem, but has talked with you about this several times

## 2012-11-04 ENCOUNTER — Other Ambulatory Visit: Payer: Self-pay | Admitting: Internal Medicine

## 2012-11-04 DIAGNOSIS — K219 Gastro-esophageal reflux disease without esophagitis: Secondary | ICD-10-CM

## 2012-11-11 DIAGNOSIS — M6289 Other specified disorders of muscle: Secondary | ICD-10-CM | POA: Insufficient documentation

## 2012-11-12 ENCOUNTER — Other Ambulatory Visit: Payer: Medicare Other

## 2012-11-15 ENCOUNTER — Ambulatory Visit
Admission: RE | Admit: 2012-11-15 | Discharge: 2012-11-15 | Disposition: A | Discharge status: Home / Self Care | Payer: Medicare Other | Source: Ambulatory Visit | Attending: Internal Medicine | Admitting: Internal Medicine

## 2012-11-15 DIAGNOSIS — K219 Gastro-esophageal reflux disease without esophagitis: Secondary | ICD-10-CM

## 2013-01-01 DIAGNOSIS — N3010 Interstitial cystitis (chronic) without hematuria: Secondary | ICD-10-CM | POA: Insufficient documentation

## 2013-04-01 ENCOUNTER — Ambulatory Visit: Payer: Medicare Other | Admitting: Cardiology

## 2013-04-14 ENCOUNTER — Encounter: Payer: Self-pay | Admitting: Cardiology

## 2013-04-14 ENCOUNTER — Telehealth: Payer: Self-pay | Admitting: Cardiology

## 2013-04-14 ENCOUNTER — Ambulatory Visit (INDEPENDENT_AMBULATORY_CARE_PROVIDER_SITE_OTHER): Payer: Medicare Other | Admitting: Cardiology

## 2013-04-14 VITALS — BP 124/72 | HR 74 | Ht 70.0 in | Wt 161.0 lb

## 2013-04-14 DIAGNOSIS — M1990 Unspecified osteoarthritis, unspecified site: Secondary | ICD-10-CM

## 2013-04-14 DIAGNOSIS — R002 Palpitations: Secondary | ICD-10-CM

## 2013-04-14 MED ORDER — NEBIVOLOL HCL 5 MG PO TABS: ORAL_TABLET | ORAL | Status: DC

## 2013-04-14 NOTE — Assessment & Plan Note (Signed)
The patient has chronic bursitis of the right hip.  This prevents her from sleeping on her right side.  Her orthopedist Dr. August Saucer has offered surgery which the patient has declined because she would be out of work for 2 long a period afterwards.  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.

## 2013-04-14 NOTE — Telephone Encounter (Signed)
Spoke with patient and [employer] may be contacting us on behalf of a recent error in refill (patient should have received Tramadol and received Metoprolol). Information only

## 2013-04-14 NOTE — Telephone Encounter (Signed)
New problem   Pt need to ask you a question. Please call pt.

## 2013-04-14 NOTE — Patient Instructions (Addendum)
Continue the Bystolic 5 mg 1/2 daily as needed as prescribed by your PCP  Your physician wants you to follow-up in: 6 MONTH OV/EKG You will receive a reminder letter in the mail two months in advance. If you don't receive a letter, please call our office to schedule the follow-up appointment.

## 2013-04-14 NOTE — Assessment & Plan Note (Signed)
The patient has not been experiencing any recent chest pain 

## 2013-04-14 NOTE — Assessment & Plan Note (Signed)
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  Since last visit she had tried some samples of Bystolic 5 mg taking one half tablet when necessary and she has less side effects from this and she did metoprolol

## 2013-04-14 NOTE — Progress Notes (Signed)
[name] Date of Birth:  1951-03-18 [facility] [address] [place], [place]  [address] [phone]         Fax   [phone]  History of Present Illness: This pleasant 62 year old female is seen for a scheduled 6 month followup office visit.  She has a past history of palpitations. She notes that she is unable to sleep on her left side. She is concerned that if she tries to sleep on her left side her heart rate accelerates almost immediately. She is also aware of palpitations and a forceful heartbeat. She is unable to sleep on her right side because of right hip pain.  She has a history of bursitis of the right hip but she does not want surgery because she would be out of work for 2 long postoperatively. Because of these concerns we did do a 24-hour Holter monitor on her last year which did not show any unusual findings and she did have just occasional benign PVCs. She does have a history of mild aortic valve disease with mild aortic stenosis noted in 2010 and her last echocardiogram showed an ejection fraction of 65-70% on 10/09/12.  Her aortic valve showed only very mild aortic stenosis. Over the winter she has been less physically active.  She had a  nuclear stress test on 04/04/12 which was normal.   Current Outpatient Prescriptions  Medication Sig Dispense Refill  . ALPRAZolam (XANAX) 0.5 MG tablet Take 0.5 mg by mouth at bedtime as needed.      . butalbital-acetaminophen-caffeine (FIORICET, ESGIC) 50-325-40 MG per tablet Take 1 tablet by mouth as needed.        . Hormone Gel Base (HRT BASE, MEN,) GEL by Does not apply route. Progesterone and divigel As directed      . hydrOXYzine (ATARAX/VISTARIL) 25 MG tablet Take 12.5 mg by mouth at bedtime.      Marland Kitchen OVER THE COUNTER MEDICATION Take by mouth daily. vemma mineral/vitamin supplement      . OVER THE COUNTER MEDICATION Take by mouth 2 (two) times daily. cysta q for bladder      . TRAMADOL HCL PO Take by mouth  as needed.        . nebivolol (BYSTOLIC) 5 MG tablet 1/ TABLET DAILY AS NEEDED  30 tablet  3   No current facility-administered medications for this visit.    Allergies  Allergen Reactions  . Diltiazem Hcl Er     Heart pounding  . Metoprolol     Heart pounding    Patient Active Problem List   Diagnosis Date Noted  . Heart murmur     Priority: High  . Palpitation     Priority: High  . Dyspnea     Priority: Medium  . Osteoarthritis 03/25/2012  . SOB (shortness of breath)   . Lightheadedness   . Chest pain     History  Smoking status  . Former Smoker  . Quit date: 04/10/1978  Smokeless tobacco  . Not on file    History  Alcohol Use No    Family History  Problem Relation Age of Onset  . Kidney failure Mother   . Cancer Father     Review of Systems: Constitutional: no fever chills diaphoresis or fatigue or change in weight.  Head and neck: no hearing loss, no epistaxis, no photophobia or visual disturbance. Respiratory: No cough, shortness of breath or wheezing. Cardiovascular: No chest pain peripheral edema, palpitations. Gastrointestinal: No abdominal distention, no  abdominal pain, no change in bowel habits hematochezia or melena. Genitourinary: No dysuria, no frequency, no urgency, no nocturia. Musculoskeletal:No arthralgias, no back pain, no gait disturbance or myalgias. Neurological: No dizziness, no headaches, no numbness, no seizures, no syncope, no weakness, no tremors. Hematologic: No lymphadenopathy, no easy bruising. Psychiatric: No confusion, no hallucinations, no sleep disturbance.    Physical Exam: Filed Vitals:   04/14/13 0946  BP: 124/72  Pulse: 74   the general appearance reveals a well-developed well-nourished woman in no distress.The head and neck exam reveals pupils equal and reactive.  Extraocular movements are full.  There is no scleral icterus.  The mouth and pharynx are normal.  The neck is supple.  The carotids reveal no bruits.   The jugular venous pressure is normal.  The  thyroid is not enlarged.  There is no lymphadenopathy.  The chest is clear to percussion and auscultation.  There are no rales or rhonchi.  Expansion of the chest is symmetrical.  The precordium is quiet.  The first heart sound is normal.  The second heart sound is physiologically split.  There is no  gallop rub or click.  There is a soft systolic ejection murmur over the aortic valve.  No diastolic murmur is heard. There is no abnormal lift or heave.  The abdomen is soft and nontender.  The bowel sounds are normal.  The liver and spleen are not enlarged.  There are no abdominal masses.  There are no abdominal bruits.  Extremities reveal good pedal pulses.  There is no phlebitis or edema.  There is no cyanosis or clubbing.  Strength is normal and symmetrical in all extremities.  There is no lateralizing weakness.  There are no sensory deficits.  The skin is warm and dry.  There is no rash.  EKG shows normal sinus rhythm and no ischemic changes.  Assessment / Plan: Overall the patient is stable.  We will continue current medication.  She was reassured as we discussed all of her normal studies including her recent echocardiogram in October 2013 and her nuclear stress test in the spring of 2013.  She is encouraged to continue regular exercise.  We will plan to recheck her for office visit and EKG in 6 months.

## 2013-04-15 ENCOUNTER — Telehealth: Payer: Self-pay | Admitting: Cardiology

## 2013-04-15 NOTE — Telephone Encounter (Signed)
New Prob     Pt would like to speak to nurse regarding some insurance issues.

## 2013-04-16 NOTE — Telephone Encounter (Signed)
Left message to call back  

## 2013-05-02 NOTE — Telephone Encounter (Signed)
Discussed with patient and everything has been taken care of

## 2013-05-08 ENCOUNTER — Ambulatory Visit: Payer: Medicare Other | Admitting: Cardiology

## 2013-08-15 ENCOUNTER — Other Ambulatory Visit: Payer: Self-pay | Admitting: Otolaryngology

## 2013-08-15 ENCOUNTER — Ambulatory Visit
Admission: RE | Admit: 2013-08-15 | Discharge: 2013-08-15 | Disposition: A | Discharge status: Home / Self Care | Payer: Medicare Other | Source: Ambulatory Visit | Attending: Otolaryngology | Admitting: Otolaryngology

## 2013-08-15 DIAGNOSIS — J40 Bronchitis, not specified as acute or chronic: Secondary | ICD-10-CM

## 2013-09-01 ENCOUNTER — Other Ambulatory Visit: Payer: Self-pay | Admitting: Internal Medicine

## 2013-09-01 DIAGNOSIS — J209 Acute bronchitis, unspecified: Secondary | ICD-10-CM

## 2013-09-02 ENCOUNTER — Ambulatory Visit
Admission: RE | Admit: 2013-09-02 | Discharge: 2013-09-02 | Disposition: A | Discharge status: Home / Self Care | Payer: Medicare Other | Source: Ambulatory Visit | Attending: Internal Medicine | Admitting: Internal Medicine

## 2013-09-02 DIAGNOSIS — J209 Acute bronchitis, unspecified: Secondary | ICD-10-CM

## 2013-09-02 MED ORDER — IOHEXOL 300 MG/ML  SOLN
75.0000 mL | Freq: Once | INTRAMUSCULAR | Status: AC | PRN
  Administered 2013-09-02: 75 mL via INTRAVENOUS

## 2013-09-09 ENCOUNTER — Ambulatory Visit (INDEPENDENT_AMBULATORY_CARE_PROVIDER_SITE_OTHER): Payer: Medicare Other | Admitting: Internal Medicine

## 2013-09-09 ENCOUNTER — Encounter: Payer: Self-pay | Admitting: Internal Medicine

## 2013-09-09 VITALS — BP 128/72 | HR 64 | Temp 97.0°F | Ht 70.0 in | Wt 162.8 lb

## 2013-09-09 DIAGNOSIS — R059 Cough, unspecified: Secondary | ICD-10-CM

## 2013-09-09 DIAGNOSIS — R05 Cough: Secondary | ICD-10-CM

## 2013-09-09 DIAGNOSIS — R918 Other nonspecific abnormal finding of lung field: Secondary | ICD-10-CM

## 2013-09-09 MED ORDER — PANTOPRAZOLE SODIUM 40 MG PO TBEC: 40.0000 mg | DELAYED_RELEASE_TABLET | Freq: Every day | ORAL | Status: DC

## 2013-09-09 MED ORDER — FAMOTIDINE 20 MG PO TABS: ORAL_TABLET | ORAL | Status: DC

## 2013-09-09 NOTE — Progress Notes (Signed)
Subjective:    Patient ID: [name], female    DOB: Oct 24, 1951   MRN: [id number]  HPI  16 yowf quit smoking 1979 s sequelae with sinus infections dating back to the 90's with fall> spring pnds s/p sinus surgery in 90's then sick p trip to [place] mid August 3 days after arrival with sinus pain bad cough rx by Haroldine Laws with nasty mucus (green) with abx cleared the mucus but worse cough dx as bronchitis > cxr > no acute changes > ct abn so referred by Dr Tyson Dense to pulmonary clinic 09/09/13    09/09/2013 1st [name] office visit/ [name]  Chief Complaint  Patient presents with  . Pulmonary Consult    Referred per Dr Marcy Salvo for eval of abnormal ct chest. Pt c/o dyspnea and pain between shoulder blades x 6 wks.    cp only centered in back not pleuritic but rather positional  Feels heaviness in chest worse first thing in am  Cough is better but still present,  Mostly dry hack and urge to clear throat day > night  No obvious day to day or daytime variabilty or assoc   chest tightness, subjective wheeze overt sinus or hb symptoms. No unusual exp hx or h/o childhood pna/ asthma or knowledge of premature birth.  Sleeping ok without nocturnal  or early am exacerbation  of respiratory  c/o's or need for noct saba. Also denies any obvious fluctuation of symptoms with weather or environmental changes or other aggravating or alleviating factors except as outlined above   Current Medications, Allergies, Complete Past Medical History, Past Surgical History, Family History, and Social History were reviewed in [employer] record.  ROS  The following are not active complaints unless bolded sore throat, dysphagia, dental problems, itching, sneezing,  nasal congestion or excess/ purulent secretions, ear ache,   fever, chills, sweats, unintended wt loss, pleuritic or exertional cp, hemoptysis,  orthopnea pnd or leg swelling, presyncope, palpitations, heartburn, abdominal pain,  anorexia, nausea, vomiting, diarrhea  or change in bowel or urinary habits, change in stools or urine, dysuria,hematuria,  rash, arthralgias, visual complaints, headache, numbness weakness or ataxia or problems with walking or coordination,  change in mood/affect or memory.         Review of Systems  Constitutional: Negative for fever, chills and unexpected weight change.  HENT: Positive for dental problem. Negative for ear pain, nosebleeds, congestion, sore throat, rhinorrhea, sneezing, trouble swallowing, voice change, postnasal drip and sinus pressure.   Eyes: Negative for visual disturbance.  Respiratory: Positive for cough and shortness of breath. Negative for choking.   Cardiovascular: Negative for chest pain and leg swelling.  Gastrointestinal: Negative for vomiting, abdominal pain and diarrhea.  Genitourinary: Negative for difficulty urinating.       Indigestion  Musculoskeletal: Positive for arthralgias.  Skin: Negative for rash.  Neurological: Negative for tremors, syncope and headaches.  Hematological: Does not bruise/bleed easily.       Objective:   Physical Exam  Wt Readings from Last 3 Encounters:  09/09/13 162 lb 12.8 oz (73.846 kg)  04/14/13 161 lb (73.029 kg)  10/02/12 158 lb 1.9 oz (71.723 kg)     amb wf with occ throat clearing  HEENT: nl dentition, turbinates, and orophanx. Nl external ear canals without cough reflex   NECK :  without JVD/Nodes/TM/ nl carotid upstrokes bilaterally   LUNGS: no acc muscle use, clear to A and P bilaterally without cough on insp or exp maneuvers   CV:  RRR  no s3 or murmur or increase in P2, no edema   ABD:  soft and nontender with nl excursion in the supine position. No bruits or organomegaly, bowel sounds nl  MS:  warm without deformities, calf tenderness, cyanosis or clubbing  SKIN: warm and dry without lesions    NEURO:  alert, approp, no deficits     cxr 08/15/13  Stable benign right upper lobe nodule. No new  focal or acute  abnormality identified.  CT chest 09/02/13  1. No acute cardiopulmonary abnormalities.  2. Spectrum of findings within the lungs, including peripheral  tree-in-bud nodularity and bronchiectasis, favor sequelae of chronic  indolent/atypical infection. 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  3. Small pericardial effusion             Assessment & Plan:

## 2013-09-09 NOTE — Patient Instructions (Addendum)
Pantoprazole (protonix) 40 mg   Take 30-60 min before first meal of the day and Pepcid 20 mg one bedtime until return to office - this is the best way to tell whether stomach acid is contributing to your problem.    GERD (REFLUX)  is an extremely common cause of respiratory symptoms, many times with no significant heartburn at all.    It can be treated with medication, but also with lifestyle changes including avoidance of late meals, excessive alcohol, smoking cessation, and avoid fatty foods, chocolate, peppermint, colas, red wine, and acidic juices such as orange juice.  NO MINT OR MENTHOL PRODUCTS SO NO COUGH DROPS  USE SUGARLESS CANDY INSTEAD (jolley ranchers or Stover's)  NO OIL BASED VITAMINS - use powdered substitutes.    Please schedule a follow up office visit in 4 weeks, sooner if needed with pft's  Late add needs esr if not improving

## 2013-09-11 ENCOUNTER — Telehealth: Payer: Self-pay | Admitting: *Deleted

## 2013-09-11 ENCOUNTER — Other Ambulatory Visit: Payer: Self-pay | Admitting: Internal Medicine

## 2013-09-11 ENCOUNTER — Institutional Professional Consult (permissible substitution): Payer: Medicare Other | Admitting: Internal Medicine

## 2013-09-11 DIAGNOSIS — R918 Other nonspecific abnormal finding of lung field: Secondary | ICD-10-CM | POA: Insufficient documentation

## 2013-09-11 DIAGNOSIS — R05 Cough: Secondary | ICD-10-CM | POA: Insufficient documentation

## 2013-09-11 MED ORDER — TRAMADOL HCL 50 MG PO TABS: ORAL_TABLET | ORAL | Status: DC

## 2013-09-11 NOTE — Assessment & Plan Note (Signed)
Although there are clearly abnormalities on CT scan, they should probably be considered "microscopic" since not obvious on plain cxr and very well could have low grade MAI as suggested by radiology but I am strongly doubtful this explains any of her acute/ recent symptoms as note the nodules have been present since 2006    In the setting of obvious "macroscopic" health issues,  I am very reluctatnt to embark on an invasive w/u at this point but will arrange consevative  follow up and in the meantime see what we can do to address the patient's subjective concerns (see cough a/p)

## 2013-09-11 NOTE — Telephone Encounter (Signed)
LMTCB

## 2013-09-11 NOTE — Assessment & Plan Note (Signed)
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   These are the same pts (now being labeled as having "irritable larynx syndrome" by some cough centers) who not infrequently have a history of having failed to tolerate ace inhibitors,  dry powder inhalers or biphosphonates or report having atypical reflux symptoms that don't respond to standard doses of PPI , and are easily confused as having aecopd or asthma flares by even experienced allergists/ pulmonologists.  ? Acid (or non-acid) GERD > always difficult to exclude as up to 75% of pts in some series report no assoc GI/ Heartburn symptoms> rec max (24h)  acid suppression and diet restrictions/ reviewed and instructions given in writting

## 2013-09-11 NOTE — Telephone Encounter (Signed)
I spoke with pt. She stated a word was not mentioned to her about this at her OV. She stated she does not feel like the pain in middle of her back is not due to a pulled muscle. She wanted to know why this was being done. MW overheard the conversation and stated he is doing this to complete work up d/t her lung nodules. If she is feeling better then she does not need labs done. I advised pt of this and she still wanted MW to talk with her. I transferred her to him as he was sitting at his desk and asked me to do so. I will forward message back to leslie to see if this still needs to be done

## 2013-09-11 NOTE — Telephone Encounter (Signed)
Message copied by Christen Butter on Thu Sep 11, 2013  1:35 PM ------      Message from: Sandrea Hughs B      Created: Thu Sep 11, 2013  5:50 AM       Call pt to see if feeling any better and let her know I reviewed all available records and needs bloodwork to complete w/u which should include cbc with diff and esr and if not done recently would like to repeat them this week  ------

## 2013-09-11 NOTE — Telephone Encounter (Signed)
Pt spoke with Dr Sherene Sires  Nothing further needed

## 2013-09-30 ENCOUNTER — Other Ambulatory Visit: Payer: Self-pay | Admitting: Internal Medicine

## 2013-10-01 ENCOUNTER — Telehealth: Payer: Self-pay | Admitting: Internal Medicine

## 2013-10-01 MED ORDER — TRAMADOL HCL 50 MG PO TABS: ORAL_TABLET | ORAL | Status: DC

## 2013-10-01 NOTE — Telephone Encounter (Signed)
Ok x one refill but must bring all meds/bottles with her to ov to regroup because this indicates we don't have the problem eliminated yet

## 2013-10-01 NOTE — Telephone Encounter (Signed)
Pt is aware to bring all meds/bottles to appt next week. Tried to call RX in but the phone system is not working once we try to switch to the pharmacy. I have printed off RX and placed on MW desk for signature. Will fax once done

## 2013-10-01 NOTE — Telephone Encounter (Signed)
Last OV 09/09/13 Pending OV 10/08/13 Last fill of Tramadol was 09/11/13 #40  MW - please advise on refill. Thanks.

## 2013-10-01 NOTE — Telephone Encounter (Signed)
I tried to contact the pt. Her # doesn't accept incoming calls. Tried to call in her rx but the phone system at [facility] is down.  We will try back later.

## 2013-10-08 ENCOUNTER — Encounter: Payer: Self-pay | Admitting: Internal Medicine

## 2013-10-08 ENCOUNTER — Ambulatory Visit (INDEPENDENT_AMBULATORY_CARE_PROVIDER_SITE_OTHER): Payer: Medicare Other | Admitting: Internal Medicine

## 2013-10-08 VITALS — BP 120/72 | HR 66 | Temp 97.8°F | Ht 69.0 in | Wt 160.0 lb

## 2013-10-08 DIAGNOSIS — R05 Cough: Secondary | ICD-10-CM

## 2013-10-08 DIAGNOSIS — R0609 Other forms of dyspnea: Secondary | ICD-10-CM

## 2013-10-08 DIAGNOSIS — R918 Other nonspecific abnormal finding of lung field: Secondary | ICD-10-CM

## 2013-10-08 DIAGNOSIS — R0600 Dyspnea, unspecified: Secondary | ICD-10-CM

## 2013-10-08 LAB — PULMONARY FUNCTION TEST

## 2013-10-08 NOTE — Patient Instructions (Addendum)
Bronchiectasis =   you have scarring of your bronchial tubes which means that they don't function perfectly normally and mucus tends to pool in certain areas of your lung which can cause pneumonia and further scarring of your lung and bronchial tubes and coughing up small amounts of blood  Whenever you develop cough congestion take mucinex or mucinex dm > these will help keep the mucus loose and flowing but if your condition worsens you need to seek help immediately preferably here or somewhere inside the [facility] system to compare xrays ( worse = darker or bloody mucus or pain on breathing in)   Ok to try off the acid suppression after 4 weeks of therapy to see what difference if any this makes and start back immediately if cough flares for any reason.  Follow cxr 08/15/14 - call sooner if needed

## 2013-10-08 NOTE — Progress Notes (Signed)
PFT done today. 

## 2013-10-08 NOTE — Progress Notes (Signed)
Subjective:    Patient ID: [name], female    DOB: [birth year]   MRN: [id number]    Brief patient profile:  62 yowf quit smoking 1979 s sequelae with sinus infections dating back to the 90's with fall> spring pnds s/p sinus surgery in 90's then sick p trip to [place] mid August 3 days after arrival with sinus pain bad cough rx by Haroldine Laws with nasty mucus (green) with abx cleared the mucus but worse cough dx as bronchitis > cxr > no acute changes > ct abn so referred by Dr Tyson Dense to pulmonary clinic 09/09/13   History of Present Illness  09/09/2013 1st [place] [facility] office visit/ [name]  Chief Complaint  Patient presents with  . Pulmonary Consult    Referred per Dr Marcy Salvo for eval of abnormal ct chest. Pt c/o dyspnea and pain between shoulder blades x 6 wks.    cp only centered in back not pleuritic but rather positional  Feels heaviness in chest worse first thing in am  Cough is better but still present,  Mostly dry hack and urge to clear throat day > night rec Pantoprazole (protonix) 40 mg   Take 30-60 min before first meal of the day and Pepcid 20 mg one bedtime until return to office - this is the best way to tell whether stomach acid is contributing to your problem.   GERD diet   10/08/2013 f/u ov/[name] re: cough/ sob ? UACS Chief Complaint  Patient presents with  . Followup with PFT    Back pain is unchanged since last visit. Cough has improved some. Her dyspnea is slightly improved.   although definitely better,  Does not feel any benefit from acid suppression or diet isues, thinks it's all weather/ season related. No more chest heaviness in ams   No obvious day to day or daytime variabilty or   subjective wheeze overt sinus or hb symptoms. No unusual exp hx or h/o childhood pna/ asthma or knowledge of premature birth.  Sleeping ok without nocturnal  or early am exacerbation  of respiratory  c/o's or need for noct saba. Also denies any obvious fluctuation of symptoms with   environmental changes or other aggravating or alleviating factors except as outlined above   Current Medications, Allergies, Complete Past Medical History, Past Surgical History, Family History, and Social History were reviewed in [employer] record.  ROS  The following are not active complaints unless bolded sore throat, dysphagia, dental problems, itching, sneezing,  nasal congestion or excess/ purulent secretions, ear ache,   fever, chills, sweats, unintended wt loss, pleuritic or exertional cp, hemoptysis,  orthopnea pnd or leg swelling, presyncope, palpitations, heartburn, abdominal pain, anorexia, nausea, vomiting, diarrhea  or change in bowel or urinary habits, change in stools or urine, dysuria,hematuria,  rash, arthralgias and back pain, visual complaints, headache, numbness weakness or ataxia or problems with walking or coordination,  change in mood/affect or memory.              Objective:   Physical Exam  . Wt Readings from Last 3 Encounters:  10/08/13 160 lb (72.576 kg)  09/09/13 162 lb 12.8 oz (73.846 kg)  04/14/13 161 lb (73.029 kg)        amb wf   no longer throat clearing  HEENT: nl dentition, turbinates, and orophanx. Nl external ear canals without cough reflex   NECK :  without JVD/Nodes/TM/ nl carotid upstrokes bilaterally   LUNGS: no acc muscle use, clear to A  and P bilaterally without cough on insp or exp maneuvers   CV:  RRR  no s3 or murmur or increase in P2, no edema   ABD:  soft and nontender with nl excursion in the supine position. No bruits or organomegaly, bowel sounds nl  MS:  warm without deformities, calf tenderness, cyanosis or clubbing  SKIN: warm and dry without lesions    NEURO:  alert, approp, no deficits     cxr 08/15/13  Stable benign right upper lobe nodule. No new focal or acute  abnormality identified.    CT chest 09/02/13  1. No acute cardiopulmonary abnormalities.  2. Spectrum of findings within the  lungs, including peripheral  tree-in-bud nodularity and bronchiectasis, favor sequelae of chronic  indolent/atypical infection. 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  3. Small pericardial effusion             Assessment & Plan:

## 2013-10-09 NOTE — Assessment & Plan Note (Addendum)
-   See CT chest 2006 and 09/02/13 1. No acute cardiopulmonary abnormalities.  2. Spectrum of findings within the lungs, including peripheral  tree-in-bud nodularity and bronchiectasis, favor sequelae of chronic  indolent/atypical infection. 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  3. Small pericardial effusion  I had an extended discussion with the patient today lasting 15 to 20 minutes of a 25 minute visit on the following issues:   Although there are clearly abnormalities on CT scan, they should probably be considered "microscopic" since not obvious on plain cxr and note the bronchiectasis is not necessarily from MAI (if present) but the MAI may be from the bronciectasis and does not necessarily represent active infection.    Unless she has definite symptoms or progression on a plain film I would not consider a more aggressive approach here.  See instructions for specific recommendations which were reviewed directly with the patient who was given a copy with highlighter outlining the key components.

## 2013-10-09 NOTE — Assessment & Plan Note (Signed)
   Pt not convinced the meds are making any difference in  symptoms so it's fine with me to try reducing them and observing for worse symptoms but needs to be more insightful about those specific symptoms related to cough/ sob  and an action plan for prns in event those symptoms worsen as taper meds that may or may not be helping.  This is the reverse of a therapeutic trial and it a way more difficult for pts to follow than adding meds to list.  "stopping meds to see if it hurts"' rather than "starting meds to see if helps"

## 2013-10-28 ENCOUNTER — Encounter: Payer: Self-pay | Admitting: Internal Medicine

## 2014-01-23 ENCOUNTER — Other Ambulatory Visit (HOSPITAL_COMMUNITY): Payer: Self-pay | Admitting: Orthopedic Surgery

## 2014-02-05 ENCOUNTER — Encounter (HOSPITAL_COMMUNITY): Payer: Self-pay | Admitting: Pharmacy Technician

## 2014-02-10 ENCOUNTER — Encounter (HOSPITAL_COMMUNITY)
Admission: RE | Admit: 2014-02-10 | Discharge: 2014-02-10 | Disposition: A | Discharge status: Home / Self Care | Payer: 59 | Source: Ambulatory Visit | Attending: Orthopedic Surgery | Admitting: Orthopedic Surgery

## 2014-02-10 ENCOUNTER — Encounter (HOSPITAL_COMMUNITY)
Admission: RE | Admit: 2014-02-10 | Discharge: 2014-02-10 | Disposition: A | Discharge status: Home / Self Care | Payer: 59 | Source: Ambulatory Visit | Attending: Anesthesiology | Admitting: Anesthesiology

## 2014-02-10 ENCOUNTER — Encounter (HOSPITAL_COMMUNITY): Payer: Self-pay

## 2014-02-10 DIAGNOSIS — Z01818 Encounter for other preprocedural examination: Secondary | ICD-10-CM | POA: Insufficient documentation

## 2014-02-10 DIAGNOSIS — Z01812 Encounter for preprocedural laboratory examination: Secondary | ICD-10-CM | POA: Insufficient documentation

## 2014-02-10 HISTORY — DX: Headache: R51

## 2014-02-10 HISTORY — DX: Inflammatory liver disease, unspecified: K75.9

## 2014-02-10 HISTORY — DX: Other complications of anesthesia, initial encounter: T88.59XA

## 2014-02-10 HISTORY — DX: Unspecified osteoarthritis, unspecified site: M19.90

## 2014-02-10 HISTORY — DX: Adverse effect of unspecified anesthetic, initial encounter: T41.45XA

## 2014-02-10 HISTORY — DX: Carpal tunnel syndrome, right upper limb: G56.01

## 2014-02-10 HISTORY — DX: Cardiac arrhythmia, unspecified: I49.9

## 2014-02-10 LAB — BASIC METABOLIC PANEL
BUN: 8 mg/dL (ref 6–23)
CALCIUM: 9.3 mg/dL (ref 8.4–10.5)
CO2: 29 mEq/L (ref 19–32)
CREATININE: 0.58 mg/dL (ref 0.50–1.10)
Chloride: 100 mEq/L (ref 96–112)
Glucose, Bld: 93 mg/dL (ref 70–99)
Potassium: 4.2 mEq/L (ref 3.7–5.3)
SODIUM: 139 meq/L (ref 137–147)

## 2014-02-10 LAB — CBC
HCT: 37.9 % (ref 36.0–46.0)
Hemoglobin: 13 g/dL (ref 12.0–15.0)
MCH: 31.8 pg (ref 26.0–34.0)
MCHC: 34.3 g/dL (ref 30.0–36.0)
MCV: 92.7 fL (ref 78.0–100.0)
PLATELETS: 213 10*3/uL (ref 150–400)
RBC: 4.09 MIL/uL (ref 3.87–5.11)
RDW: 12.9 % (ref 11.5–15.5)
WBC: 4.9 10*3/uL (ref 4.0–10.5)

## 2014-02-10 MED ORDER — CHLORHEXIDINE GLUCONATE 4 % EX LIQD: 60.0000 mL | Freq: Once | CUTANEOUS | Status: DC

## 2014-02-10 NOTE — Pre-Procedure Instructions (Signed)
[name]  02/10/2014   Your procedure is scheduled on:  Tuesday March 10 th 1401 PM  Report to [facility] En[facility] "A" at 1201 PM.  Call this number if you have problems the morning of surgery: [phone]   Remember:   Do not eat food or drink liquids after midnight Monday.   Take these medicines the morning of surgery with A SIP OF WATER: Bystolic (Nebivolol), Tramadol if needed for pain, and Fioricet if needed for migraine headache.  Stop Vitamins, Aspirin, Herbal medication, and Nsaids(Advil, Aleve, Ibuprofen) on 02/12/2014 prior to surgery.  Do not wear jewelry, make-up or nail polish.  Do not wear lotions, powders, or perfumes. You may wear deodorant.  Do not shave 48 hours prior to surgery.   Do not bring valuables to the hospital.  [facility] is not responsible for any belongings or valuables.               Contacts, dentures or bridgework may not be worn into surgery.  Leave suitcase in the car. After surgery it may be brought to your room.  For patients admitted to the hospital, discharge time is determined by your  treatment team.               Patients discharged the day of surgery will not be allowed to drive home.    Special Instructions: [facility] - Preparing for Surgery  Before surgery, you can play an important role.  Because skin is not sterile, your skin needs to be as free of germs as possible.  You can reduce the number of germs on you skin by washing with CHG (chlorahexidine gluconate) soap before surgery.  CHG is an antiseptic cleaner which kills germs and bonds with the skin to continue killing germs even after washing.  Please DO NOT use if you have an allergy to CHG or antibacterial soaps.  If your skin becomes reddened/irritated stop using the CHG and inform your nurse when you arrive at Short Stay.  Do not shave (including legs and underarms) for at least 48 hours prior to the first CHG shower.  You may shave your face.  Please follow these  instructions carefully:   1.  Shower with CHG Soap the night before surgery and the  morning of Surgery.  2.  If you choose to wash your hair, wash your hair first as usual withyour normal shampoo.  3.  After you shampoo, rinse your hair and body thoroughly to remove the Shampoo.  4.  Use CHG as you would any other liquid soap.  You can apply chg directly to the skin and wash gently with scrungie or a clean washcloth.  5.  Apply the CHG Soap to your body ONLY FROM THE NECK DOWN.  Do not use on open wounds or open sores.  Avoid contact with your eyes ears, mouth and genitals (private parts).  Wash genitals (private parts) with your normal soap.  6.  Wash thoroughly, paying special attention to the area where your surgery will be performed.  7.  Thoroughly rinse your body with warm water from the neck down.  8.  DO NOT shower/wash with your normal soap after using and rinsing off the CHG Soap.  9.  Pat yourself dry with a clean towel.            10.  Wear clean pajamas.            11.  Place clean sheets on your  bed the night of your first shower and do not sleep with pets.  Day of Surgery  Do not apply any lotions/deoderants the morning of surgery.  Please wear clean clothes to the hospital/surgery center.      Please read over the following fact sheets that you were given: Pain Booklet, Coughing and Deep Breathing and Surgical Site Infection Prevention

## 2014-02-16 MED ORDER — CEFAZOLIN SODIUM-DEXTROSE 2-3 GM-% IV SOLR
2.0000 g | INTRAVENOUS | Status: AC
  Administered 2014-02-17: 2 g via INTRAVENOUS
  Filled 2014-02-16: qty 50

## 2014-02-17 ENCOUNTER — Encounter (HOSPITAL_COMMUNITY): Payer: 59 | Admitting: Anesthesiology

## 2014-02-17 ENCOUNTER — Ambulatory Visit (HOSPITAL_COMMUNITY): Payer: 59 | Admitting: Anesthesiology

## 2014-02-17 ENCOUNTER — Encounter (HOSPITAL_COMMUNITY): Payer: Self-pay | Admitting: Certified Registered"

## 2014-02-17 ENCOUNTER — Encounter (HOSPITAL_COMMUNITY)
Admission: RE | Disposition: A | Discharge status: Home / Self Care | Payer: Self-pay | Source: Ambulatory Visit | Attending: Orthopedic Surgery

## 2014-02-17 ENCOUNTER — Ambulatory Visit (HOSPITAL_COMMUNITY)
Admission: RE | Admit: 2014-02-17 | Discharge: 2014-02-17 | Disposition: A | Discharge status: Home / Self Care | Payer: 59 | Source: Ambulatory Visit | Attending: Orthopedic Surgery | Admitting: Orthopedic Surgery

## 2014-02-17 DIAGNOSIS — R42 Dizziness and giddiness: Secondary | ICD-10-CM | POA: Insufficient documentation

## 2014-02-17 DIAGNOSIS — R0989 Other specified symptoms and signs involving the circulatory and respiratory systems: Secondary | ICD-10-CM | POA: Insufficient documentation

## 2014-02-17 DIAGNOSIS — R011 Cardiac murmur, unspecified: Secondary | ICD-10-CM | POA: Insufficient documentation

## 2014-02-17 DIAGNOSIS — R0602 Shortness of breath: Secondary | ICD-10-CM | POA: Insufficient documentation

## 2014-02-17 DIAGNOSIS — R079 Chest pain, unspecified: Secondary | ICD-10-CM | POA: Insufficient documentation

## 2014-02-17 DIAGNOSIS — M7071 Other bursitis of hip, right hip: Secondary | ICD-10-CM

## 2014-02-17 DIAGNOSIS — R0609 Other forms of dyspnea: Secondary | ICD-10-CM | POA: Insufficient documentation

## 2014-02-17 DIAGNOSIS — Z87891 Personal history of nicotine dependence: Secondary | ICD-10-CM | POA: Insufficient documentation

## 2014-02-17 DIAGNOSIS — R002 Palpitations: Secondary | ICD-10-CM | POA: Insufficient documentation

## 2014-02-17 DIAGNOSIS — M76899 Other specified enthesopathies of unspecified lower limb, excluding foot: Secondary | ICD-10-CM | POA: Insufficient documentation

## 2014-02-17 HISTORY — PX: EXCISION/RELEASE BURSA HIP: SHX5014

## 2014-02-17 SURGERY — RELEASE, BURSA, TROCHANTERIC
Anesthesia: General | Site: Hip | Laterality: Right

## 2014-02-17 MED ORDER — FENTANYL CITRATE 0.05 MG/ML IJ SOLN
INTRAMUSCULAR | Status: DC | PRN
  Administered 2014-02-17: 50 ug via INTRAVENOUS
  Administered 2014-02-17: 100 ug via INTRAVENOUS
  Administered 2014-02-17 (×2): 50 ug via INTRAVENOUS

## 2014-02-17 MED ORDER — FENTANYL CITRATE 0.05 MG/ML IJ SOLN
100.0000 ug | Freq: Once | INTRAMUSCULAR | Status: DC
  Filled 2014-02-17: qty 2

## 2014-02-17 MED ORDER — PROPOFOL 10 MG/ML IV BOLUS
INTRAVENOUS | Status: DC | PRN
  Administered 2014-02-17: 150 mg via INTRAVENOUS

## 2014-02-17 MED ORDER — FENTANYL CITRATE 0.05 MG/ML IJ SOLN
INTRAMUSCULAR | Status: AC
  Filled 2014-02-17: qty 5

## 2014-02-17 MED ORDER — ROCURONIUM BROMIDE 100 MG/10ML IV SOLN
INTRAVENOUS | Status: DC | PRN
  Administered 2014-02-17: 40 mg via INTRAVENOUS

## 2014-02-17 MED ORDER — EPHEDRINE SULFATE 50 MG/ML IJ SOLN
INTRAMUSCULAR | Status: AC
  Filled 2014-02-17: qty 1

## 2014-02-17 MED ORDER — LACTATED RINGERS IV SOLN
INTRAVENOUS | Status: DC
  Administered 2014-02-17: 13:00:00 via INTRAVENOUS

## 2014-02-17 MED ORDER — PROMETHAZINE HCL 25 MG/ML IJ SOLN
6.2500 mg | INTRAMUSCULAR | Status: DC | PRN
  Administered 2014-02-17: 6.25 mg via INTRAVENOUS

## 2014-02-17 MED ORDER — ONDANSETRON HCL 4 MG/2ML IJ SOLN
INTRAMUSCULAR | Status: DC | PRN
  Administered 2014-02-17: 4 mg via INTRAVENOUS

## 2014-02-17 MED ORDER — ROCURONIUM BROMIDE 50 MG/5ML IV SOLN
INTRAVENOUS | Status: AC
  Filled 2014-02-17: qty 1

## 2014-02-17 MED ORDER — PROMETHAZINE HCL 25 MG/ML IJ SOLN
INTRAMUSCULAR | Status: AC
  Filled 2014-02-17: qty 1

## 2014-02-17 MED ORDER — FENTANYL CITRATE 0.05 MG/ML IJ SOLN
INTRAMUSCULAR | Status: DC
Start: 2014-02-17 — End: 2014-02-18
  Filled 2014-02-17: qty 2

## 2014-02-17 MED ORDER — EPHEDRINE SULFATE 50 MG/ML IJ SOLN
INTRAMUSCULAR | Status: DC | PRN
  Administered 2014-02-17: 5 mg via INTRAVENOUS

## 2014-02-17 MED ORDER — BUPIVACAINE HCL (PF) 0.25 % IJ SOLN
INTRAMUSCULAR | Status: DC | PRN
  Administered 2014-02-17: 30 mL

## 2014-02-17 MED ORDER — MIDAZOLAM HCL 2 MG/2ML IJ SOLN
INTRAMUSCULAR | Status: AC
  Filled 2014-02-17: qty 2

## 2014-02-17 MED ORDER — FENTANYL CITRATE 0.05 MG/ML IJ SOLN
INTRAMUSCULAR | Status: AC
  Filled 2014-02-17: qty 2

## 2014-02-17 MED ORDER — CHLORHEXIDINE GLUCONATE 4 % EX LIQD
60.0000 mL | Freq: Once | CUTANEOUS | Status: DC
  Filled 2014-02-17: qty 60

## 2014-02-17 MED ORDER — SUCCINYLCHOLINE CHLORIDE 20 MG/ML IJ SOLN
INTRAMUSCULAR | Status: AC
  Filled 2014-02-17: qty 1

## 2014-02-17 MED ORDER — HYDROCODONE-ACETAMINOPHEN 10-325 MG PO TABS
1.0000 | ORAL_TABLET | Freq: Once | ORAL | Status: AC
  Administered 2014-02-17: 1 via ORAL
  Filled 2014-02-17: qty 2

## 2014-02-17 MED ORDER — LIDOCAINE HCL (CARDIAC) 20 MG/ML IV SOLN
INTRAVENOUS | Status: DC | PRN
  Administered 2014-02-17: 60 mg via INTRAVENOUS

## 2014-02-17 MED ORDER — NEOSTIGMINE METHYLSULFATE 1 MG/ML IJ SOLN
INTRAMUSCULAR | Status: DC | PRN
  Administered 2014-02-17: 3.5 mg via INTRAVENOUS

## 2014-02-17 MED ORDER — GLYCOPYRROLATE 0.2 MG/ML IJ SOLN
INTRAMUSCULAR | Status: AC
  Filled 2014-02-17: qty 3

## 2014-02-17 MED ORDER — LIDOCAINE HCL (CARDIAC) 20 MG/ML IV SOLN
INTRAVENOUS | Status: AC
  Filled 2014-02-17: qty 5

## 2014-02-17 MED ORDER — BUPIVACAINE HCL (PF) 0.25 % IJ SOLN
INTRAMUSCULAR | Status: AC
  Filled 2014-02-17: qty 30

## 2014-02-17 MED ORDER — PHENYLEPHRINE HCL 10 MG/ML IJ SOLN
INTRAMUSCULAR | Status: DC | PRN
  Administered 2014-02-17: 40 ug via INTRAVENOUS

## 2014-02-17 MED ORDER — PHENYLEPHRINE 40 MCG/ML (10ML) SYRINGE FOR IV PUSH (FOR BLOOD PRESSURE SUPPORT)
PREFILLED_SYRINGE | INTRAVENOUS | Status: AC
  Filled 2014-02-17: qty 10

## 2014-02-17 MED ORDER — FENTANYL CITRATE 0.05 MG/ML IJ SOLN
25.0000 ug | INTRAMUSCULAR | Status: DC | PRN
  Administered 2014-02-17: 25 ug via INTRAVENOUS
  Administered 2014-02-17: 50 ug via INTRAVENOUS
  Administered 2014-02-17: 25 ug via INTRAVENOUS

## 2014-02-17 MED ORDER — SODIUM CHLORIDE 0.9 % IJ SOLN
INTRAMUSCULAR | Status: AC
  Filled 2014-02-17: qty 10

## 2014-02-17 MED ORDER — GLYCOPYRROLATE 0.2 MG/ML IJ SOLN
INTRAMUSCULAR | Status: DC | PRN
  Administered 2014-02-17 (×2): 0.4 mg via INTRAVENOUS

## 2014-02-17 MED ORDER — NEOSTIGMINE METHYLSULFATE 1 MG/ML IJ SOLN
INTRAMUSCULAR | Status: AC
  Filled 2014-02-17: qty 10

## 2014-02-17 MED ORDER — FENTANYL CITRATE 0.05 MG/ML IJ SOLN
INTRAMUSCULAR | Status: AC
  Administered 2014-02-17: 100 ug
  Filled 2014-02-17: qty 2

## 2014-02-17 MED ORDER — LACTATED RINGERS IV SOLN
INTRAVENOUS | Status: DC | PRN
  Administered 2014-02-17 (×2): via INTRAVENOUS

## 2014-02-17 MED ORDER — PROPOFOL 10 MG/ML IV BOLUS
INTRAVENOUS | Status: AC
  Filled 2014-02-17: qty 20

## 2014-02-17 MED ORDER — SODIUM CHLORIDE 0.9 % IR SOLN
Status: DC | PRN
  Administered 2014-02-17: 1000 mL

## 2014-02-17 MED ORDER — OXYCODONE-ACETAMINOPHEN 10-325 MG PO TABS: 1.0000 | ORAL_TABLET | Freq: Four times a day (QID) | ORAL | Status: DC | PRN

## 2014-02-17 MED ORDER — MEPERIDINE HCL 25 MG/ML IJ SOLN: 6.2500 mg | INTRAMUSCULAR | Status: DC | PRN

## 2014-02-17 MED ORDER — MIDAZOLAM HCL 5 MG/5ML IJ SOLN
INTRAMUSCULAR | Status: DC | PRN
  Administered 2014-02-17: 2 mg via INTRAVENOUS

## 2014-02-17 SURGICAL SUPPLY — 36 items
COVER SURGICAL LIGHT HANDLE (MISCELLANEOUS) ×3 IMPLANT
DRAPE ORTHO SPLIT 77X108 STRL (DRAPES) ×6
DRAPE SURG ORHT 6 SPLT 77X108 (DRAPES) ×2 IMPLANT
DRAPE U-SHAPE 47X51 STRL (DRAPES) ×3 IMPLANT
DRSG ADAPTIC 3X8 NADH LF (GAUZE/BANDAGES/DRESSINGS) ×3 IMPLANT
DRSG MEPILEX BORDER 4X8 (GAUZE/BANDAGES/DRESSINGS) ×2 IMPLANT
DRSG PAD ABDOMINAL 8X10 ST (GAUZE/BANDAGES/DRESSINGS) ×3 IMPLANT
DURAPREP 26ML APPLICATOR (WOUND CARE) ×3 IMPLANT
ELECT CAUTERY BLADE 6.4 (BLADE) IMPLANT
ELECT REM PT RETURN 9FT ADLT (ELECTROSURGICAL) ×3
ELECTRODE REM PT RTRN 9FT ADLT (ELECTROSURGICAL) IMPLANT
GAUZE XEROFORM 1X8 LF (GAUZE/BANDAGES/DRESSINGS) ×2 IMPLANT
GLOVE BIOGEL PI IND STRL 8 (GLOVE) ×1 IMPLANT
GLOVE BIOGEL PI INDICATOR 8 (GLOVE) ×2
GLOVE SURG ORTHO 8.0 STRL STRW (GLOVE) ×3 IMPLANT
GOWN STRL REUS W/ TWL LRG LVL3 (GOWN DISPOSABLE) ×1 IMPLANT
GOWN STRL REUS W/TWL LRG LVL3 (GOWN DISPOSABLE) ×3
HANDPIECE INTERPULSE COAX TIP (DISPOSABLE)
KIT BASIN OR (CUSTOM PROCEDURE TRAY) ×3 IMPLANT
KIT ROOM TURNOVER OR (KITS) ×3 IMPLANT
MANIFOLD NEPTUNE II (INSTRUMENTS) ×3 IMPLANT
NS IRRIG 1000ML POUR BTL (IV SOLUTION) ×3 IMPLANT
PACK TOTAL JOINT (CUSTOM PROCEDURE TRAY) ×3 IMPLANT
PAD ARMBOARD 7.5X6 YLW CONV (MISCELLANEOUS) ×6 IMPLANT
SET HNDPC FAN SPRY TIP SCT (DISPOSABLE) IMPLANT
SPONGE GAUZE 4X4 12PLY (GAUZE/BANDAGES/DRESSINGS) ×3 IMPLANT
SPONGE LAP 18X18 X RAY DECT (DISPOSABLE) ×3 IMPLANT
SUT PROLENE 3 0 PS 2 (SUTURE) ×2 IMPLANT
SUT VIC AB 2-0 CT1 27 (SUTURE) ×3
SUT VIC AB 2-0 CT1 TAPERPNT 27 (SUTURE) IMPLANT
SUT VICRYL 3 0 (SUTURE) ×2 IMPLANT
TOWEL OR 17X24 6PK STRL BLUE (TOWEL DISPOSABLE) ×3 IMPLANT
TOWEL OR 17X26 10 PK STRL BLUE (TOWEL DISPOSABLE) ×3 IMPLANT
TUBE ANAEROBIC SPECIMEN COL (MISCELLANEOUS) IMPLANT
UNDERPAD 30X30 INCONTINENT (UNDERPADS AND DIAPERS) ×3 IMPLANT
WATER STERILE IRR 1000ML POUR (IV SOLUTION) ×1 IMPLANT

## 2014-02-17 NOTE — Progress Notes (Signed)
Orthopedic Tech Progress Note Patient Details:  [name] [birth year] [id number]  Ortho Devices Type of Ortho Device: Crutches Ortho Device/Splint Interventions: Ordered;Adjustment   Braulio Bosch 02/17/2014, 6:44 PM

## 2014-02-17 NOTE — Brief Op Note (Signed)
02/17/2014  5:37 PM  PATIENT:  [name]  63 y.o. female  PRE-OPERATIVE DIAGNOSIS:  RIGHT HIP TROCHANTERIC BURSITIS  POST-OPERATIVE DIAGNOSIS:  RIGHT HIP TROCHANTERIC BURSITIS  PROCEDURE:  Procedure(s): EXCISION/RELEASE BURSA HIP  SURGEON:  Surgeon(s): Meredith Pel, MD  ASSISTANT: none  ANESTHESIA:   none  EBL: 10 ml    Total I/O In: 1000 [I.V.:1000] Out: -   BLOOD ADMINISTERED: none  DRAINS: none   LOCAL MEDICATIONS USED:  marcaine  SPECIMEN:  No Specimen  COUNTS:  YES  TOURNIQUET:  * No tourniquets in log *  DICTATION: .Other Dictation: Dictation Number [id number]  PLAN OF CARE: Discharge to home after PACU  PATIENT DISPOSITION:  PACU - hemodynamically stable

## 2014-02-17 NOTE — Anesthesia Preprocedure Evaluation (Addendum)
Anesthesia Evaluation  Patient identified by MRN, date of birth, ID band Patient awake    Reviewed: Allergy & Precautions, H&P , NPO status , Patient's Chart, lab work & pertinent test results, reviewed documented beta blocker date and time   History of Anesthesia Complications Negative for: history of anesthetic complications  Airway Mallampati: II TM Distance: >3 FB Neck ROM: full    Dental  (+) Teeth Intact, Dental Advisory Given   Pulmonary shortness of breath, former smoker,  breath sounds clear to auscultation        Cardiovascular Exercise Tolerance: Good + dysrhythmias + Valvular Problems/Murmurs AS Rhythm:regular Rate:Normal + Systolic murmurs Mild AS   Neuro/Psych  Headaches,  Neuromuscular disease    GI/Hepatic (+) Hepatitis -, A  Endo/Other    Renal/GU      Musculoskeletal   Abdominal (+)  Abdomen: soft. Bowel sounds: normal.  Peds  Hematology   Anesthesia Other Findings   Reproductive/Obstetrics                        Anesthesia Physical Anesthesia Plan  ASA: II  Anesthesia Plan: General ETT   Post-op Pain Management:    Induction:   Airway Management Planned:   Additional Equipment:   Intra-op Plan:   Post-operative Plan:   Informed Consent: I have reviewed the patients History and Physical, chart, labs and discussed the procedure including the risks, benefits and alternatives for the proposed anesthesia with the patient or authorized representative who has indicated his/her understanding and acceptance.   Dental Advisory Given  Plan Discussed with: CRNA and Surgeon  Anesthesia Plan Comments:         Anesthesia Quick Evaluation

## 2014-02-17 NOTE — Anesthesia Postprocedure Evaluation (Signed)
  Anesthesia Post-op Note  Patient: [name]  Procedure(s) Performed: Procedure(s) with comments: EXCISION/RELEASE BURSA HIP (Right) - RIGHT HIP TENSOR FASCIAL LATERAL RELEASE.  Patient Location: PACU  Anesthesia Type:General  Level of Consciousness: awake and alert   Airway and Oxygen Therapy: Patient Spontanous Breathing  Post-op Pain: mild  Post-op Assessment: Post-op Vital signs reviewed, Patient's Cardiovascular Status Stable and Respiratory Function Stable  Post-op Vital Signs: Reviewed  Filed Vitals:   02/17/14 1815  BP: 121/55  Pulse: 54  Temp:   Resp: 8    Complications: No apparent anesthesia complications

## 2014-02-17 NOTE — Anesthesia Procedure Notes (Signed)
Procedure Name: Intubation Date/Time: 02/17/2014 4:37 PM Performed by: Maeola Harman Pre-anesthesia Checklist: Patient identified, Emergency Drugs available, Suction available, Patient being monitored and Timeout performed Patient Re-evaluated:Patient Re-evaluated prior to inductionOxygen Delivery Method: Circle system utilized Preoxygenation: Pre-oxygenation with 100% oxygen Intubation Type: IV induction Ventilation: Mask ventilation without difficulty Laryngoscope Size: Mac and 3 Grade View: Grade I Tube size: 7.5 mm Number of attempts: 1 Airway Equipment and Method: Stylet Placement Confirmation: ETT inserted through vocal cords under direct vision,  positive ETCO2 and breath sounds checked- equal and bilateral Secured at: 22 cm Tube secured with: Tape Dental Injury: Teeth and Oropharynx as per pre-operative assessment  Comments: Easy atraumatic induction and intubation with MAC 3 blade, Dr. Ola Spurr verified placement.  Gabrielle Session, CRNA

## 2014-02-17 NOTE — Transfer of Care (Signed)
Immediate Anesthesia Transfer of Care Note  Patient: [name]  Procedure(s) Performed: Procedure(s) with comments: EXCISION/RELEASE BURSA HIP (Right) - RIGHT HIP TENSOR FASCIAL LATERAL RELEASE.  Patient Location: PACU  Anesthesia Type:General  Level of Consciousness: awake, alert  and oriented  Airway & Oxygen Therapy: Patient connected to face mask oxygen  Post-op Assessment: Report given to PACU RN  Post vital signs: stable  Complications: No apparent anesthesia complications

## 2014-02-17 NOTE — H&P (Signed)
[name] is an 63 y.o. female.   Chief Complaint: Right hip pain  HPI: [name] is a 63 year old female with right hip pain. She has long-standing hip pain over the past several years. She's had good response to injection x2. MRI scan arms no other intra-articular hip pathology. MRI scan of her back also shows no contributing radicular type symptoms. Last injection in the hip did not did sustain relief in the trochanteric bursa. She has had 2 prior trochanter bursa injections which gave her very good relief but that was several years ago. He reports persistent stating debilitating pain which interferes with her sleep typically when she is lying on the right-hand side. She localizes tenderness directly to the trochanteric bursal area MRI scan does not show any other tendinitis or tendon rupture repair he has.  Past Medical History  Diagnosis Date  . Heart murmur   . Dyspnea   . SOB (shortness of breath)   . Lightheadedness   . Chest pain     WHEN SLEEPING ON LEFT SIDE  . Palpitation   . Complication of anesthesia     felt funny afterward, sleep did help some  . Dysrhythmia     rapid heart rate at night  . Headache(784.0)     migraines  . Arthritis     lower back  . Hepatitis 1976    hep A  . Carpal tunnel syndrome of right wrist     Past Surgical History  Procedure Laterality Date  . Nasal sinus surgery    . Back surgery      x 2  . Partial hysterectomy    . Elbow surgery Right 2008    tennis elbow  . Abdominal hysterectomy    . Colonoscopy      Family History  Problem Relation Age of Onset  . Kidney failure Mother   . Lung cancer Father     smoked  . Emphysema Father     smoked   Social History:  reports that she quit smoking about 35 years ago. Her smoking use included Cigarettes. She has a 2.25 pack-year smoking history. She has never used smokeless tobacco. She reports that she does not drink alcohol or use illicit drugs.  Allergies: No Known Allergies  No  prescriptions prior to admission    No results found for this or any previous visit (from the past 48 hour(s)). No results found.  Review of Systems  Constitutional: Negative.   HENT: Negative.   Eyes: Negative.   Respiratory: Negative.   Cardiovascular: Negative.   Gastrointestinal: Negative.   Genitourinary: Negative.   Musculoskeletal: Negative.   Skin: Negative.   Neurological: Negative.   Endo/Heme/Allergies: Negative.   Psychiatric/Behavioral: Negative.     There were no vitals taken for this visit. Physical Exam  Constitutional: She appears well-developed.  HENT:  Head: Normocephalic.  Eyes: Pupils are equal, round, and reactive to light.  Neck: Normal range of motion.  Cardiovascular: Normal rate.   Neurological: She is alert.  Skin: Skin is warm.  Psychiatric: She has a normal mood and affect.   examination the right hip demonstrates full active and passive range of motion tenderness on the trochanteric bursa on the right good hip flexion adduction abduction strength no nerve retention signs no paresthesias palpable pedal pulses symmetric reflexes  Assessment/Plan Impression is right hip trochanteric bursitis refractory to nonoperative management including injections therapy activity avoidance  medications plan at this time is for right hip iliotibial band and tensor  fascia lata release. Risk and benefits discussed the patient then went to infection incomplete pain relief possibility of stenting of the hip. Nonetheless with iliotibial band and lateral release and bursectomy think the patient is a reasonable chance of having symptom improvement. Plan outpatient procedure weightbearing as tolerated with crutches postop all questions answered.  DEAN,GREGORY SCOTT 02/17/2014, 7:18 AM

## 2014-02-17 NOTE — Preoperative (Signed)
Beta Blockers   Reason not to administer Beta Blockers:Not Applicable 

## 2014-02-18 NOTE — Op Note (Signed)
NAME:  [name], [name] NO.:  [id number]  MEDICAL RECORD NO.:  [id number]  LOCATION:  [facility]                         FACILITY:  [facility]  PHYSICIAN:  Anderson Malta, M.D.    DATE OF BIRTH:  03/27/1951  DATE OF PROCEDURE: DATE OF DISCHARGE:  02/17/2014                              OPERATIVE REPORT   PREOPERATIVE DIAGNOSIS:  Right hip trochanteric bursitis __________ iliotibial band, and tight tensor fascia lata.  POSTOPERATIVE DIAGNOSIS:  Right hip trochanteric bursitis __________ iliotibial band, and tight tensor fascia lata.  PROCEDURE:  Longitudinal division of tensor fascia lata with bursectomy over the trochanter.  SURGEON:  Anderson Malta, M.D.  ASSISTANT:  None.  ANESTHESIA:  General.  INDICATIONS:  Brief, the patient with right hip trochanteric bursitis presents for operative management after explanation of  risks and benefits.  She had a long history of nonoperative management of the problem and presents now for operative management.  PROCEDURE IN DETAIL:  The patient was brought to the operating room where general endotracheal anesthesia was induced.  Preoperative antibiotics were administered.  Time-out was called.  The patient was placed in the lateral position with left axilla and peroneal nerve well padded.  Right hip, leg down into the ankle was prescrubbed with alcohol and Betadine which allowed to air dry.  Prepped with DuraPrep solution, draped in sterile manner.  Charlie Pitter was used to cover the operative field. A 6 cm incision was made over the greater trochanter.  Skin and subcu tissue were sharply divided.  The tensor fascia lata was encountered over the greater trochanter.  This was divided longitudinally in its midportion from the vastus lateralis attachment, proximally to the muscle tendon junction of tensor fascia lata.  Bursectomy was then performed.  The patient had good release of the tight fascia lata longitudinally.  Thorough irrigation  was then performed.  The skin was then closed using 0 Vicryl suture and running 3-0 Prolene.  Marcaine was used to anesthetize the skin.  Mepilex applied.     Anderson Malta, M.D.    GSD/MEDQ  D:  02/17/2014  T:  02/18/2014  Job:  [id number]

## 2014-02-19 ENCOUNTER — Encounter (HOSPITAL_COMMUNITY): Payer: Self-pay | Admitting: Orthopedic Surgery

## 2014-03-20 ENCOUNTER — Other Ambulatory Visit: Payer: Self-pay | Admitting: Orthopedic Surgery

## 2014-03-20 DIAGNOSIS — R531 Weakness: Secondary | ICD-10-CM

## 2014-03-20 DIAGNOSIS — M545 Low back pain, unspecified: Secondary | ICD-10-CM

## 2014-03-20 DIAGNOSIS — M549 Dorsalgia, unspecified: Secondary | ICD-10-CM

## 2014-03-26 ENCOUNTER — Other Ambulatory Visit: Payer: Medicare Other

## 2014-03-26 ENCOUNTER — Ambulatory Visit
Admission: RE | Admit: 2014-03-26 | Discharge: 2014-03-26 | Disposition: A | Discharge status: Home / Self Care | Payer: 59 | Source: Ambulatory Visit | Attending: Orthopedic Surgery | Admitting: Orthopedic Surgery

## 2014-03-26 DIAGNOSIS — M545 Low back pain, unspecified: Secondary | ICD-10-CM

## 2014-03-26 DIAGNOSIS — R531 Weakness: Secondary | ICD-10-CM

## 2014-03-26 DIAGNOSIS — M549 Dorsalgia, unspecified: Secondary | ICD-10-CM

## 2014-03-26 MED ORDER — GADOBENATE DIMEGLUMINE 529 MG/ML IV SOLN
15.0000 mL | Freq: Once | INTRAVENOUS | Status: AC | PRN
  Administered 2014-03-26: 15 mL via INTRAVENOUS

## 2014-07-07 ENCOUNTER — Encounter: Payer: Self-pay | Admitting: Cardiology

## 2014-07-07 ENCOUNTER — Ambulatory Visit (INDEPENDENT_AMBULATORY_CARE_PROVIDER_SITE_OTHER): Payer: 59 | Admitting: Cardiology

## 2014-07-07 VITALS — BP 130/64 | HR 65 | Ht 70.0 in | Wt 157.1 lb

## 2014-07-07 DIAGNOSIS — M1990 Unspecified osteoarthritis, unspecified site: Secondary | ICD-10-CM

## 2014-07-07 DIAGNOSIS — R002 Palpitations: Secondary | ICD-10-CM

## 2014-07-07 DIAGNOSIS — R011 Cardiac murmur, unspecified: Secondary | ICD-10-CM

## 2014-07-07 NOTE — Assessment & Plan Note (Signed)
Patient has a history of palpitations.  These can occur any time day or night.  EKG today shows a single PVC.  She will try omitting caffeine from her diet.

## 2014-07-07 NOTE — Assessment & Plan Note (Signed)
The patient has a known heart murmur of mild aortic stenosis.  She is not having any exertional dyspnea or exertional dizziness or syncope or exertional chest pain.  Her murmur remains very minimal

## 2014-07-07 NOTE — Progress Notes (Addendum)
[name] Date of Birth:  Oct 02, 1951 [facility] [address] [place], [place]  [address] [phone]        Fax   [phone]   History of Present Illness: This pleasant 63 year old female is seen for a  followup office visit. She has a past history of palpitations. She notes that she is unable to sleep on her left side. She is concerned that if she tries to sleep on her left side her heart rate accelerates almost immediately. She is also aware of palpitations and a forceful heartbeat. She is unable to sleep on her right side because of right hip pain.  Since last visit she has had surgery on her right hip with removal of the right hip bursa by her orthopedist Dr. Marlou Sa.  We did do a 24-hour Holter monitor on her last year which did not show any unusual findings and she did have just occasional benign PVCs. She does have a history of mild aortic valve disease with mild aortic stenosis noted in 2010 and her last echocardiogram showed an ejection fraction of 65-70% on 10/09/12. Her aortic valve showed only very mild aortic stenosis. Over the winter she has been less physically active. She had a nuclear stress test on 04/04/12 which was normal. Since last visit she has had more problems with low back pain following her right hip surgery.  She has been [facility] at [facility].  She is now getting physical therapy.  She plans to start water aerobics soon.   Current Outpatient Prescriptions  Medication Sig Dispense Refill  . ALPRAZolam (XANAX) 0.5 MG tablet Take 0.5 mg by mouth at bedtime.       . butalbital-acetaminophen-caffeine (FIORICET, ESGIC) 50-325-40 MG per tablet Take 1 tablet by mouth every 4 (four) hours as needed for headache or migraine.       . Estradiol (DIVIGEL) 1 MG/GM GEL Place 1 application onto the skin every morning.      . nebivolol (BYSTOLIC) 5 MG tablet Take 2.5 mg by mouth at bedtime as needed (rapid heart rate).      . NON FORMULARY  Take 1 tablet by mouth daily. vemma mineral/vitamin supplement.  Special order      . NONFORMULARY OR COMPOUNDED ITEM Apply 1 application topically at bedtime. Progesterone      . traMADol (ULTRAM) 50 MG tablet Take 50 mg by mouth 2 (two) times daily.       No current facility-administered medications for this visit.    No Known Allergies  Patient Active Problem List   Diagnosis Date Noted  . Heart murmur     Priority: High  . Palpitation     Priority: High  . Dyspnea     Priority: Medium  . Cough 09/11/2013  . Multiple pulmonary nodules assoc with bronchiectasis 09/11/2013  . Osteoarthritis 03/25/2012  . SOB (shortness of breath)   . Lightheadedness   . Chest pain     History  Smoking status  . Former Smoker -- 0.25 packs/day for 9 years  . Types: Cigarettes  . Quit date: 04/14/1978  Smokeless tobacco  . Never Used    History  Alcohol Use No    Family History  Problem Relation Age of Onset  . Kidney failure Mother   . Lung cancer Father     smoked  . Emphysema Father     smoked    Review of Systems: Constitutional: no fever chills diaphoresis or fatigue or  change in weight.  Head and neck: no hearing loss, no epistaxis, no photophobia or visual disturbance. Respiratory: No cough, shortness of breath or wheezing. Cardiovascular: No chest pain peripheral edema, palpitations. Gastrointestinal: No abdominal distention, no abdominal pain, no change in bowel habits hematochezia or melena. Genitourinary: No dysuria, no frequency, no urgency, no nocturia. Musculoskeletal:No arthralgias, no back pain, no gait disturbance or myalgias. Neurological: No dizziness, no headaches, no numbness, no seizures, no syncope, no weakness, no tremors. Hematologic: No lymphadenopathy, no easy bruising. Psychiatric: No confusion, no hallucinations, no sleep disturbance.    Physical Exam: Filed Vitals:   07/07/14 0811  BP: 130/64  Pulse: 65   general appearance reveals a  well-developed well-nourished woman in no distress.The head and neck exam reveals pupils equal and reactive.  Extraocular movements are full.  There is no scleral icterus.  The mouth and pharynx are normal.  The neck is supple.  The carotids reveal no bruits.  The jugular venous pressure is normal.  The  thyroid is not enlarged.  There is no lymphadenopathy.  The chest is clear to percussion and auscultation.  There are no rales or rhonchi.  Expansion of the chest is symmetrical.  The precordium is quiet.  The first heart sound is normal.  The second heart sound is physiologically split.  There is grade 1/6 systolic ejection murmur at the base.  No diastolic murmur..  There is no abnormal lift or heave.  The abdomen is soft and nontender.  The bowel sounds are normal.  The liver and spleen are not enlarged.  There are no abdominal masses.  There are no abdominal bruits.  Extremities reveal good pedal pulses.  There is no phlebitis or edema.  There is no cyanosis or clubbing.  Strength is normal and symmetrical in all extremities.  There is no lateralizing weakness.  There are no sensory deficits.  The skin is warm and dry.  There is no rash.   EKG shows normal sinus rhythm with occasional PVC.  No ischemic changes.  Since the previous tracing of 04/14/13, PVC is present  Assessment / Plan: 1. Palpitations 2. occasional PVCs 3.  Heart murmur secondary to mild aortic stenosis 4. osteoarthritis status post removal of bursa from right hip and status post low back surgery x2 with persistent residual pain, now being followed at [facility]  Plan: Continue current medication.  Recheck in 6 months for followup office visit.  Try to avoid caffeine and see if there is a beneficial effect on PVCs

## 2014-07-07 NOTE — Assessment & Plan Note (Signed)
The patient is limited by her low back pain.  She hopes to start water aerobic exercises to improve her mobility.

## 2014-07-07 NOTE — Patient Instructions (Signed)
Try to limit your caffeine intake  Increase your cardio exercise  Your physician wants you to follow-up in: 6 months You will receive a reminder letter in the mail two months in advance. If you don't receive a letter, please call our office to schedule the follow-up appointment.

## 2014-07-23 ENCOUNTER — Encounter: Payer: Self-pay | Admitting: Internal Medicine

## 2014-07-23 ENCOUNTER — Ambulatory Visit (INDEPENDENT_AMBULATORY_CARE_PROVIDER_SITE_OTHER): Payer: Medicare Other | Admitting: Internal Medicine

## 2014-07-23 ENCOUNTER — Ambulatory Visit (INDEPENDENT_AMBULATORY_CARE_PROVIDER_SITE_OTHER)
Admission: RE | Admit: 2014-07-23 | Discharge: 2014-07-23 | Disposition: A | Discharge status: Home / Self Care | Payer: Medicare Other | Source: Ambulatory Visit | Attending: Internal Medicine | Admitting: Internal Medicine

## 2014-07-23 VITALS — BP 128/64 | HR 59 | Temp 98.8°F | Ht 70.0 in | Wt 160.0 lb

## 2014-07-23 DIAGNOSIS — R918 Other nonspecific abnormal finding of lung field: Secondary | ICD-10-CM

## 2014-07-23 DIAGNOSIS — R05 Cough: Secondary | ICD-10-CM

## 2014-07-23 DIAGNOSIS — R059 Cough, unspecified: Secondary | ICD-10-CM

## 2014-07-23 MED ORDER — LEVALBUTEROL TARTRATE 45 MCG/ACT IN AERO: 2.0000 | INHALATION_SPRAY | RESPIRATORY_TRACT | Status: DC | PRN

## 2014-07-23 NOTE — Progress Notes (Signed)
Quick Note:  Spoke with pt and notified of results per Dr. Wert. Pt verbalized understanding and denied any questions.  ______ 

## 2014-07-23 NOTE — Patient Instructions (Addendum)
Pantoprazole (protonix) 40 mg   Take 30-60 min before first meal of the day and Pepcid 20 mg one bedtime until return to office - this is the best way to tell whether stomach acid is contributing to your problem.    GERD (REFLUX)  is an extremely common cause of respiratory symptoms, many times with no significant heartburn at all.    It can be treated with medication, but also with lifestyle changes including avoidance of late meals, excessive alcohol, smoking cessation, and avoid fatty foods, chocolate, peppermint, colas, red wine, and acidic juices such as orange juice.  NO MINT OR MENTHOL PRODUCTS SO NO COUGH DROPS  USE SUGARLESS CANDY INSTEAD (jolley ranchers or Stover's)  NO OIL BASED VITAMINS - use powdered substitutes.   Please remember to go to the x-ray department downstairs for your tests - we will call you with the results when they are available.  Best inhaler should you need one to use as needed is  xopenex 1-2 puffs every 4 hours and does not cause heart racing  Please schedule a follow up office visit in 6 m, call sooner if needed

## 2014-07-23 NOTE — Assessment & Plan Note (Addendum)
-   See CT chest 2006 and 09/02/13 1. No acute cardiopulmonary abnormalities.  2. Spectrum of findings within the lungs, including peripheral  tree-in-bud nodularity and bronchiectasis, favor sequelae of chronic  indolent/atypical infection. 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  3. Small pericardial effusion   No purulent sputum, no doe, no lateralizing so doubt present symptoms represent bronchiectasis flare - the bronchiectasis may however explain some of the cough and sob and partial response to saba  rec try xopenex prn as won't excacerbate the palp relative to the racemic  Product  F/u 6 m, sooner if needed

## 2014-07-23 NOTE — Assessment & Plan Note (Signed)
Classic Upper airway cough syndrome, so named because it's frequently impossible to sort out how much is  CR/sinusitis with freq throat clearing (which can be related to primary GERD)   vs  causing  secondary (" extra esophageal")  GERD from wide swings in gastric pressure that occur with throat clearing, often  promoting self use of mint and menthol lozenges that reduce the lower esophageal sphincter tone and exacerbate the problem further in a cyclical fashion.   These are the same pts (now being labeled as having "irritable larynx syndrome" by some cough centers) who not infrequently have a history of having failed to tolerate ace inhibitors,  dry powder inhalers or biphosphonates or report having atypical reflux symptoms that don't respond to standard doses of PPI , and are easily confused as having aecopd or asthma flares by even experienced allergists/ pulmonologists.   rec resume max gerd rx - See instructions for specific recommendations which were reviewed directly with the patient who was given a copy with highlighter outlining the key components.

## 2014-07-23 NOTE — Progress Notes (Signed)
Subjective:    Patient ID: [name], female    DOB: Jul 25, 1951   MRN: [id number]    Brief patient profile:  42 yowf quit smoking 1979 s sequelae with sinus infections dating back to the 90's with fall> spring pnds s/p sinus surgery in 90's then sick p trip to [place] mid August 3 days after arrival with sinus pain bad cough rx by Ernesto Rutherford with nasty mucus (green) with abx cleared the mucus but worse cough dx as bronchitis > cxr > no acute changes > ct abn so referred by Dr Jackson Latino to pulmonary clinic 09/09/13 with dx of bronchiectasis 09/02/13   History of Present Illness  09/09/2013 1st [facility] office visit/ [name]  Chief Complaint  Patient presents with  . Pulmonary Consult    Referred per Dr [name] for eval of abnormal ct chest. Pt c/o dyspnea and pain between shoulder blades x 6 wks.    cp only centered in back not pleuritic but rather positional  Feels heaviness in chest worse first thing in am  Cough is better but still present,  Mostly dry hack and urge to clear throat day > night rec Pantoprazole (protonix) 40 mg   Take 30-60 min before first meal of the day and Pepcid 20 mg one bedtime until return to office - this is the best way to tell whether stomach acid is contributing to your problem.   GERD diet   10/08/2013 f/u ov/[name] re: cough/ sob ? UACS Chief Complaint  Patient presents with  . Followup with PFT    Back pain is unchanged since last visit. Cough has improved some. Her dyspnea is slightly improved.   although definitely better,  Does not feel any benefit from acid suppression or diet isues, thinks it's all weather/ season related. No more chest heaviness in ams rec Bronchiectasis =   you have scarring of your bronchial tubes  Ok to try off the acid suppression after 4 weeks of therapy to see what difference if any this makes and start back immediately if cough flares for any reason.   07/23/2014 f/u ov/[name] re: recurrent cough, did not activate action  plan  Chief Complaint  Patient presents with  . Acute Visit    Pt c/o trouble taking a deep breath in the morning and intermittnly throughout the day. C/o increase in work of breathing, dry cough and chest heaviness and mid upper back x 1 week.    onset 2 weeks abrupt sensation of palpitations "with skip beat" Then 1 week prior to OV  Sensation of not being able to take a deep breath rx with alb due to findings on pfts but not really much better and caused palp worse so not suing  No change ex tol/ no sob walking, no trouble sitting, sleeping   Problems with speaking with hoarseness, sob  Pain is midline has same pattern and characteristics as prev episodes   No obvious day to day or daytime variabilty or excess / purulent secretions,   subjective wheeze overt sinus or hb symptoms. No unusual exp hx or h/o childhood pna/ asthma or knowledge of premature birth.  Sleeping ok without nocturnal  or early am exacerbation  of respiratory  c/o's or need for noct saba. Also denies any obvious fluctuation of symptoms with  environmental changes or other aggravating or alleviating factors except as outlined above   Current Medications, Allergies, Complete Past Medical History, Past Surgical History, Family History, and Social History were reviewed in [facility] electronic medical record.  ROS  The following are not active complaints unless bolded sore throat, dysphagia, dental problems, itching, sneezing,  nasal congestion or excess/ purulent secretions, ear ache,   fever, chills, sweats, unintended wt loss, pleuritic or exertional cp, hemoptysis,  orthopnea pnd or leg swelling, presyncope, palpitations, heartburn, abdominal pain, anorexia, nausea, vomiting, diarrhea  or change in bowel or urinary habits, change in stools or urine, dysuria,hematuria,  rash, arthralgias and back pain, visual complaints, headache, numbness weakness or ataxia or problems with walking or coordination,  change in mood/affect  or memory.              Objective:   Physical Exam  07/23/2014        160  Wt Readings from Last 3 Encounters:  10/08/13 160 lb (72.576 kg)  09/09/13 162 lb 12.8 oz (73.846 kg)  04/14/13 161 lb (73.029 kg)        amb wf   freq  throat clearing  HEENT: nl dentition, turbinates, and orophanx. Nl external ear canals without cough reflex   NECK :  without JVD/Nodes/TM/ nl carotid upstrokes bilaterally   LUNGS: no acc muscle use, clear to A and P bilaterally without cough on insp or exp maneuvers   CV:  RRR  no s3 or murmur or increase in P2, no edema   ABD:  soft and nontender with nl excursion in the supine position. No bruits or organomegaly, bowel sounds nl  MS:  warm without deformities, calf tenderness, cyanosis or clubbing  SKIN: warm and dry without lesions    NEURO:  alert, approp, no deficits          CT chest 09/02/13  1. No acute cardiopulmonary abnormalities.  2. Spectrum of findings within the lungs, including peripheral  tree-in-bud nodularity and bronchiectasis, favor sequelae of chronic  indolent/atypical infection. 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  3. Small pericardial effusion  CXR  07/23/2014 : No active cardiopulmonary disease. Stable benign nodularity over the periphery of the right mid to upper lung.              Assessment & Plan:

## 2014-12-09 ENCOUNTER — Other Ambulatory Visit: Payer: Self-pay | Admitting: Gastroenterology

## 2014-12-09 DIAGNOSIS — R1013 Epigastric pain: Secondary | ICD-10-CM

## 2014-12-10 ENCOUNTER — Ambulatory Visit
Admission: RE | Admit: 2014-12-10 | Discharge: 2014-12-10 | Disposition: A | Discharge status: Home / Self Care | Payer: Medicare Other | Source: Ambulatory Visit | Attending: Gastroenterology | Admitting: Gastroenterology

## 2014-12-10 DIAGNOSIS — R1013 Epigastric pain: Secondary | ICD-10-CM

## 2014-12-14 DIAGNOSIS — G43909 Migraine, unspecified, not intractable, without status migrainosus: Secondary | ICD-10-CM | POA: Diagnosis not present

## 2014-12-14 DIAGNOSIS — J4520 Mild intermittent asthma, uncomplicated: Secondary | ICD-10-CM | POA: Diagnosis not present

## 2014-12-14 DIAGNOSIS — E784 Other hyperlipidemia: Secondary | ICD-10-CM | POA: Diagnosis not present

## 2014-12-14 DIAGNOSIS — K219 Gastro-esophageal reflux disease without esophagitis: Secondary | ICD-10-CM | POA: Diagnosis not present

## 2014-12-14 DIAGNOSIS — K589 Irritable bowel syndrome without diarrhea: Secondary | ICD-10-CM | POA: Diagnosis not present

## 2015-01-04 DIAGNOSIS — B89 Unspecified parasitic disease: Secondary | ICD-10-CM | POA: Diagnosis not present

## 2015-01-04 DIAGNOSIS — Z1211 Encounter for screening for malignant neoplasm of colon: Secondary | ICD-10-CM | POA: Diagnosis not present

## 2015-01-04 DIAGNOSIS — K5900 Constipation, unspecified: Secondary | ICD-10-CM | POA: Diagnosis not present

## 2015-01-12 DIAGNOSIS — B89 Unspecified parasitic disease: Secondary | ICD-10-CM | POA: Diagnosis not present

## 2015-01-27 DIAGNOSIS — N760 Acute vaginitis: Secondary | ICD-10-CM | POA: Diagnosis not present

## 2015-02-09 DIAGNOSIS — J4520 Mild intermittent asthma, uncomplicated: Secondary | ICD-10-CM | POA: Diagnosis not present

## 2015-02-09 DIAGNOSIS — F419 Anxiety disorder, unspecified: Secondary | ICD-10-CM | POA: Diagnosis not present

## 2015-02-09 DIAGNOSIS — K589 Irritable bowel syndrome without diarrhea: Secondary | ICD-10-CM | POA: Diagnosis not present

## 2015-02-09 DIAGNOSIS — M5489 Other dorsalgia: Secondary | ICD-10-CM | POA: Diagnosis not present

## 2015-02-09 DIAGNOSIS — M5126 Other intervertebral disc displacement, lumbar region: Secondary | ICD-10-CM | POA: Diagnosis not present

## 2015-02-11 ENCOUNTER — Ambulatory Visit: Payer: Self-pay | Admitting: Cardiology

## 2015-02-23 DIAGNOSIS — H01004 Unspecified blepharitis left upper eyelid: Secondary | ICD-10-CM | POA: Diagnosis not present

## 2015-02-23 DIAGNOSIS — H01001 Unspecified blepharitis right upper eyelid: Secondary | ICD-10-CM | POA: Diagnosis not present

## 2015-02-23 DIAGNOSIS — H01002 Unspecified blepharitis right lower eyelid: Secondary | ICD-10-CM | POA: Diagnosis not present

## 2015-02-23 DIAGNOSIS — H04123 Dry eye syndrome of bilateral lacrimal glands: Secondary | ICD-10-CM | POA: Diagnosis not present

## 2015-03-30 DIAGNOSIS — L820 Inflamed seborrheic keratosis: Secondary | ICD-10-CM | POA: Diagnosis not present

## 2015-03-30 DIAGNOSIS — D224 Melanocytic nevi of scalp and neck: Secondary | ICD-10-CM | POA: Diagnosis not present

## 2015-03-30 DIAGNOSIS — D2262 Melanocytic nevi of left upper limb, including shoulder: Secondary | ICD-10-CM | POA: Diagnosis not present

## 2015-03-30 DIAGNOSIS — D2261 Melanocytic nevi of right upper limb, including shoulder: Secondary | ICD-10-CM | POA: Diagnosis not present

## 2015-03-30 DIAGNOSIS — D225 Melanocytic nevi of trunk: Secondary | ICD-10-CM | POA: Diagnosis not present

## 2015-03-30 DIAGNOSIS — D1721 Benign lipomatous neoplasm of skin and subcutaneous tissue of right arm: Secondary | ICD-10-CM | POA: Diagnosis not present

## 2015-03-31 DIAGNOSIS — G5601 Carpal tunnel syndrome, right upper limb: Secondary | ICD-10-CM | POA: Diagnosis not present

## 2015-03-31 DIAGNOSIS — M25561 Pain in right knee: Secondary | ICD-10-CM | POA: Diagnosis not present

## 2015-03-31 DIAGNOSIS — M25562 Pain in left knee: Secondary | ICD-10-CM | POA: Diagnosis not present

## 2015-04-08 ENCOUNTER — Other Ambulatory Visit: Payer: Self-pay | Admitting: *Deleted

## 2015-04-08 ENCOUNTER — Encounter: Payer: Self-pay | Admitting: Cardiology

## 2015-04-08 ENCOUNTER — Ambulatory Visit (INDEPENDENT_AMBULATORY_CARE_PROVIDER_SITE_OTHER): Payer: Medicare Other | Admitting: Cardiology

## 2015-04-08 VITALS — BP 110/74 | HR 74 | Ht 70.0 in | Wt 160.0 lb

## 2015-04-08 DIAGNOSIS — R002 Palpitations: Secondary | ICD-10-CM

## 2015-04-08 DIAGNOSIS — R011 Cardiac murmur, unspecified: Secondary | ICD-10-CM

## 2015-04-08 NOTE — Progress Notes (Signed)
Cardiology Office Note   Date:  04/08/2015   ID:  Kaesha, Kirsch 12-Feb-1951, MRN [id number]  PCP:  Philis Fendt, MD  Cardiologist:   Darlin Coco, MD   No chief complaint on file.     History of Present Illness: [name] is a 63 y.o. female who presents for a six-month follow-up office visit  This pleasant 64 year old female is seen for a followup office visit. She has a past history of palpitations. She notes that she is unable to sleep on her left side. She is concerned that if she tries to sleep on her left side her heart rate accelerates almost immediately. She is also aware of palpitations and a forceful heartbeat. She is unable to sleep on her right side because of right hip pain. Since last visit she has had surgery on her right hip with removal of the right hip bursa by her orthopedist Dr. Marlou Sa. We did do a 24-hour Holter monitor on her last year which did not show any unusual findings and she did have just occasional benign PVCs. She does have a history of mild aortic valve disease with mild aortic stenosis noted in 2010 and her last echocardiogram showed an ejection fraction of 65-70% on 10/09/12. Her aortic valve showed only very mild aortic stenosis. Over the winter she has been less physically active. She had a nuclear stress test on 04/04/12 which was normal. Since last visit she has not had any new cardiac symptoms.  Her heart has not been racing much.  She remains on low-dose nebivolol which she takes at bedtime. I Dr. has diagnosed her with dry eyes.  She is using drops.  Past Medical History  Diagnosis Date  . Heart murmur   . Dyspnea   . SOB (shortness of breath)   . Lightheadedness   . Chest pain     WHEN SLEEPING ON LEFT SIDE  . Palpitation   . Complication of anesthesia     felt funny afterward, sleep did help some  . Dysrhythmia     rapid heart rate at night  . Headache(784.0)     migraines  . Arthritis     lower back  .  Hepatitis 1976    hep A  . Carpal tunnel syndrome of right wrist     Past Surgical History  Procedure Laterality Date  . Nasal sinus surgery    . Back surgery      x 2  . Partial hysterectomy    . Elbow surgery Right 2008    tennis elbow  . Abdominal hysterectomy    . Colonoscopy    . Excision/release bursa hip Right 02/17/2014    Procedure: EXCISION/RELEASE BURSA HIP;  Surgeon: Meredith Pel, MD;  Location: [facility];  Service: Orthopedics;  Laterality: Right;  RIGHT HIP TENSOR FASCIAL LATERAL RELEASE.     Current Outpatient Prescriptions  Medication Sig Dispense Refill  . ALPRAZolam (XANAX) 0.5 MG tablet Take 0.5 mg by mouth at bedtime.     . butalbital-acetaminophen-caffeine (FIORICET, ESGIC) 50-325-40 MG per tablet Take 1 tablet by mouth every 4 (four) hours as needed for headache or migraine.     . Estradiol (DIVIGEL) 1 MG/GM GEL Place 1 application onto the skin every morning.    . nebivolol (BYSTOLIC) 5 MG tablet Take 2.5 mg by mouth at bedtime as needed (rapid heart rate).    . NON FORMULARY Take 1 tablet by mouth daily. vemma mineral/vitamin supplement.  Special order    .  NONFORMULARY OR COMPOUNDED ITEM Apply 1 application topically at bedtime. Progesterone    . traMADol (ULTRAM) 50 MG tablet Take 50 mg by mouth 2 (two) times daily.     No current facility-administered medications for this visit.    Allergies:   Diflucan    Social History:  The patient  reports that she quit smoking about 37 years ago. Her smoking use included Cigarettes. She has a 2.25 pack-year smoking history. She has never used smokeless tobacco. She reports that she does not drink alcohol or use illicit drugs.   Family History:  The patient's family history includes Emphysema in her father; Kidney failure in her mother; Lung cancer in her father.    ROS:  Please see the history of present illness.   Otherwise, review of systems are positive for none.   All other systems are reviewed and  negative.    PHYSICAL EXAM: VS:  BP 110/74 mmHg  Pulse 74  Ht 5\' 10"  (1.778 m)  Wt 160 lb (72.576 kg)  BMI 22.96 kg/m2 , BMI Body mass index is 22.96 kg/(m^2). GEN: Well nourished, well developed, in no acute distress HEENT: normal Neck: no JVD, carotid bruits, or masses Cardiac: RRR;  rubs, or gallops,no edema .  There is a grade 1/6 systolic ejection murmur at the base.  No diastolic murmur. Respiratory:  clear to auscultation bilaterally, normal work of breathing GI: soft, nontender, nondistended, + BS MS: no deformity or atrophy Skin: warm and dry, no rash Neuro:  Strength and sensation are intact Psych: euthymic mood, full affect   EKG:  EKG is not ordered today.    Recent Labs: No results found for requested labs within last 365 days.    Lipid Panel No results found for: CHOL, TRIG, HDL, CHOLHDL, VLDL, LDLCALC, LDLDIRECT    Wt Readings from Last 3 Encounters:  04/08/15 160 lb (72.576 kg)  07/23/14 160 lb (72.576 kg)  07/07/14 157 lb 1.9 oz (71.269 kg)         ASSESSMENT AND PLAN:  1.Palpitations 2. occasional PVCs 3. Heart murmur secondary to mild aortic stenosis 4. osteoarthritis status post removal of bursa from right hip.  Since she had the surgery on her right hip, she has been able to lie on her right side with less discomfort and therefore is sleeping better.  Plan: Continue current medication. Recheck in 6 months for followup office visit.And EKG   Current medicines are reviewed at length with the patient today.  The patient does not have concerns regarding medicines.  The following changes have been made:  no change  Labs/ tests ordered today include:  No orders of the defined types were placed in this encounter.     Continue current medication.  Recheck in 6 months for office visit and EKG  Signed, Darlin Coco, MD  04/08/2015 5:26 PM    [address], [place], [place]  [address] Phone: [phone]; Fax: [phone]

## 2015-04-08 NOTE — Patient Instructions (Signed)
Medication Instructions:  Your physician recommends that you continue on your current medications as directed. Please refer to the Current Medication list given to you today.  Labwork: none  Testing/Procedures: none  Follow-Up: Your physician wants you to follow-up in: 6 month ov/ekg  You will receive a reminder letter in the mail two months in advance. If you don't receive a letter, please call our office to schedule the follow-up appointment.     

## 2015-04-28 DIAGNOSIS — M5416 Radiculopathy, lumbar region: Secondary | ICD-10-CM | POA: Diagnosis not present

## 2015-04-28 DIAGNOSIS — M4806 Spinal stenosis, lumbar region: Secondary | ICD-10-CM | POA: Diagnosis not present

## 2015-05-17 DIAGNOSIS — R011 Cardiac murmur, unspecified: Secondary | ICD-10-CM | POA: Diagnosis not present

## 2015-05-17 DIAGNOSIS — M5126 Other intervertebral disc displacement, lumbar region: Secondary | ICD-10-CM | POA: Diagnosis not present

## 2015-05-17 DIAGNOSIS — J4520 Mild intermittent asthma, uncomplicated: Secondary | ICD-10-CM | POA: Diagnosis not present

## 2015-05-17 DIAGNOSIS — E784 Other hyperlipidemia: Secondary | ICD-10-CM | POA: Diagnosis not present

## 2015-05-18 DIAGNOSIS — R339 Retention of urine, unspecified: Secondary | ICD-10-CM | POA: Diagnosis not present

## 2015-05-18 DIAGNOSIS — N3010 Interstitial cystitis (chronic) without hematuria: Secondary | ICD-10-CM | POA: Diagnosis not present

## 2015-05-18 DIAGNOSIS — N9489 Other specified conditions associated with female genital organs and menstrual cycle: Secondary | ICD-10-CM | POA: Diagnosis not present

## 2015-05-24 DIAGNOSIS — H04123 Dry eye syndrome of bilateral lacrimal glands: Secondary | ICD-10-CM | POA: Diagnosis not present

## 2015-05-24 DIAGNOSIS — H01001 Unspecified blepharitis right upper eyelid: Secondary | ICD-10-CM | POA: Diagnosis not present

## 2015-06-08 DIAGNOSIS — M5136 Other intervertebral disc degeneration, lumbar region: Secondary | ICD-10-CM | POA: Diagnosis not present

## 2015-06-08 DIAGNOSIS — M5416 Radiculopathy, lumbar region: Secondary | ICD-10-CM | POA: Diagnosis not present

## 2015-09-20 DIAGNOSIS — Z131 Encounter for screening for diabetes mellitus: Secondary | ICD-10-CM | POA: Diagnosis not present

## 2015-09-20 DIAGNOSIS — F419 Anxiety disorder, unspecified: Secondary | ICD-10-CM | POA: Diagnosis not present

## 2015-09-20 DIAGNOSIS — E784 Other hyperlipidemia: Secondary | ICD-10-CM | POA: Diagnosis not present

## 2015-09-20 DIAGNOSIS — J4520 Mild intermittent asthma, uncomplicated: Secondary | ICD-10-CM | POA: Diagnosis not present

## 2015-09-20 DIAGNOSIS — Z1389 Encounter for screening for other disorder: Secondary | ICD-10-CM | POA: Diagnosis not present

## 2015-09-20 DIAGNOSIS — J302 Other seasonal allergic rhinitis: Secondary | ICD-10-CM | POA: Diagnosis not present

## 2015-09-30 DIAGNOSIS — H9042 Sensorineural hearing loss, unilateral, left ear, with unrestricted hearing on the contralateral side: Secondary | ICD-10-CM | POA: Diagnosis not present

## 2015-09-30 DIAGNOSIS — H9312 Tinnitus, left ear: Secondary | ICD-10-CM | POA: Diagnosis not present

## 2015-09-30 DIAGNOSIS — H8102 Meniere's disease, left ear: Secondary | ICD-10-CM | POA: Diagnosis not present

## 2015-10-13 DIAGNOSIS — H9312 Tinnitus, left ear: Secondary | ICD-10-CM | POA: Diagnosis not present

## 2015-10-13 DIAGNOSIS — H9042 Sensorineural hearing loss, unilateral, left ear, with unrestricted hearing on the contralateral side: Secondary | ICD-10-CM | POA: Diagnosis not present

## 2015-10-13 DIAGNOSIS — H8102 Meniere's disease, left ear: Secondary | ICD-10-CM | POA: Diagnosis not present

## 2015-10-20 DIAGNOSIS — H01001 Unspecified blepharitis right upper eyelid: Secondary | ICD-10-CM | POA: Diagnosis not present

## 2015-10-20 DIAGNOSIS — H04123 Dry eye syndrome of bilateral lacrimal glands: Secondary | ICD-10-CM | POA: Diagnosis not present

## 2015-10-20 DIAGNOSIS — H2513 Age-related nuclear cataract, bilateral: Secondary | ICD-10-CM | POA: Diagnosis not present

## 2015-10-20 DIAGNOSIS — H524 Presbyopia: Secondary | ICD-10-CM | POA: Diagnosis not present

## 2015-11-01 DIAGNOSIS — Z1231 Encounter for screening mammogram for malignant neoplasm of breast: Secondary | ICD-10-CM | POA: Diagnosis not present

## 2015-11-01 DIAGNOSIS — Z1272 Encounter for screening for malignant neoplasm of vagina: Secondary | ICD-10-CM | POA: Diagnosis not present

## 2015-11-01 DIAGNOSIS — Z01419 Encounter for gynecological examination (general) (routine) without abnormal findings: Secondary | ICD-10-CM | POA: Diagnosis not present

## 2015-11-01 DIAGNOSIS — Z6823 Body mass index (BMI) 23.0-23.9, adult: Secondary | ICD-10-CM | POA: Diagnosis not present

## 2015-12-16 ENCOUNTER — Ambulatory Visit (INDEPENDENT_AMBULATORY_CARE_PROVIDER_SITE_OTHER): Payer: Medicare Other | Admitting: Cardiology

## 2015-12-16 ENCOUNTER — Encounter: Payer: Self-pay | Admitting: Cardiology

## 2015-12-16 VITALS — BP 120/68 | HR 70 | Ht 70.0 in | Wt 163.8 lb

## 2015-12-16 DIAGNOSIS — R011 Cardiac murmur, unspecified: Secondary | ICD-10-CM | POA: Diagnosis not present

## 2015-12-16 DIAGNOSIS — R002 Palpitations: Secondary | ICD-10-CM

## 2015-12-16 NOTE — Progress Notes (Signed)
Cardiology Office Note   Date:  12/16/2015   ID:  [name], [name] [birth year], MRN [id number]  PCP:  Philis Fendt, MD  Cardiologist: Darlin Coco MD  Chief Complaint  Patient presents with  . Follow-up    palpitations denies cp/sob/le edema      History of Present Illness: [name] is a 65 y.o. female who presents for scheduled follow-up visit   She has a past history of palpitations. She notes that she is unable to sleep on her left side. She is concerned that if she tries to sleep on her left side her heart rate accelerates almost immediately. She is also aware of palpitations and a forceful heartbeat.  We did do a 24-hour Holter monitor on her last year which did not show any unusual findings and she did have just occasional benign PVCs. She does have a history of mild aortic valve disease with mild aortic stenosis noted in 2010 and her last echocardiogram showed an ejection fraction of 65-70% on 10/09/12. Her aortic valve showed only very mild aortic stenosis.. She had a nuclear stress test on 04/04/12 which was normal. Since last visit she feels that her palpitations have worsened.  If she drinks even one glass of red wine she notes worsening of the palpitations.  She has the palpitations every night.  Usually she is aware of them when she wakes up to go to the bathroom in the middle of the night.  She is worried about her heart.  She does have Xanax on hand which she takes at bedtime to help her sleep.   Past Medical History  Diagnosis Date  . Heart murmur   . Dyspnea   . SOB (shortness of breath)   . Lightheadedness   . Chest pain     WHEN SLEEPING ON LEFT SIDE  . Palpitation   . Complication of anesthesia     felt funny afterward, sleep did help some  . Dysrhythmia     rapid heart rate at night  . Headache(784.0)     migraines  . Arthritis     lower back  . Hepatitis 1976    hep A  . Carpal tunnel syndrome of right wrist     Past Surgical  History  Procedure Laterality Date  . Nasal sinus surgery    . Back surgery      x 2  . Partial hysterectomy    . Elbow surgery Right 2008    tennis elbow  . Abdominal hysterectomy    . Colonoscopy    . Excision/release bursa hip Right 02/17/2014    Procedure: EXCISION/RELEASE BURSA HIP;  Surgeon: Meredith Pel, MD;  Location: [facility];  Service: Orthopedics;  Laterality: Right;  RIGHT HIP TENSOR FASCIAL LATERAL RELEASE.     Current Outpatient Prescriptions  Medication Sig Dispense Refill  . ALPRAZolam (XANAX) 0.5 MG tablet Take 0.5 mg by mouth at bedtime.     . butalbital-acetaminophen-caffeine (FIORICET, ESGIC) 50-325-40 MG per tablet Take 1 tablet by mouth every 4 (four) hours as needed for headache or migraine.     . Estradiol (DIVIGEL) 1 MG/GM GEL Place 1 application onto the skin every morning.    . nebivolol (BYSTOLIC) 5 MG tablet Take 2.5 mg by mouth at bedtime as needed (rapid heart rate).    . NON FORMULARY Take 1 tablet by mouth daily. vemma mineral/vitamin supplement.  Special order    . NONFORMULARY OR COMPOUNDED ITEM Apply 1 application topically  at bedtime. Progesterone    . traMADol (ULTRAM) 50 MG tablet Take 50 mg by mouth 2 (two) times daily.     No current facility-administered medications for this visit.    Allergies:   Diflucan    Social History:  The patient  reports that she quit smoking about 37 years ago. Her smoking use included Cigarettes. She has a 2.25 pack-year smoking history. She has never used smokeless tobacco. She reports that she does not drink alcohol or use illicit drugs.   Family History:  The patient's family history includes Emphysema in her father; Kidney failure in her mother; Lung cancer in her father.    ROS:  Please see the history of present illness.   Otherwise, review of systems are positive for none.   All other systems are reviewed and negative.    PHYSICAL EXAM: VS:  BP 120/68 mmHg  Pulse 70  Ht 5\' 10"  (1.778 m)  Wt 163  lb 12.8 oz (74.299 kg)  BMI 23.50 kg/m2 , BMI Body mass index is 23.5 kg/(m^2). GEN: Well nourished, well developed, in no acute distress HEENT: normal Neck: no JVD, carotid bruits, or masses Cardiac: RRR; there is a soft systolic ejection murmur at the base.  No gallop.  No rub.  No peripheral edema.   Respiratory:  clear to auscultation bilaterally, normal work of breathing GI: soft, nontender, nondistended, + BS MS: no deformity or atrophy Skin: warm and dry, no rash Neuro:  Strength and sensation are intact Psych: euthymic mood, full affect   EKG:  EKG is ordered today. The ekg ordered today demonstrates normal sinus rhythm with incomplete right bundle branch block and no ischemic changes  Recent Labs: No results found for requested labs within last 365 days.    Lipid Panel No results found for: CHOL, TRIG, HDL, CHOLHDL, VLDL, LDLCALC, LDLDIRECT    Wt Readings from Last 3 Encounters:  12/16/15 163 lb 12.8 oz (74.299 kg)  04/08/15 160 lb (72.576 kg)  07/23/14 160 lb (72.576 kg)         ASSESSMENT AND PLAN:  1.Palpitations 2. occasional PVCs Noted on prior Holter monitor several years ago  3. Heart murmur secondary to mild aortic stenosis 4. osteoarthritis status post removal of bursa from right hip. Since she had the surgery on her right hip,she is not certain that the surgery helped the situation.  She still has difficulty lying on either side.  Current medicines are reviewed at length with the patient today.  The patient does not have concerns regarding medicines.  The following changes have been made:  no change  Labs/ tests ordered today include:   Orders Placed This Encounter  Procedures  . Holter monitor - 48 hour  . EKG 12-Lead   Disposition: Continue current medication.  We will arrange for a 48 hour Holter monitor to see what type of arrhythmia she is having.  She states that she has it every night.  We will have her return in 6 months for a  follow-up office visit with Dr. Meda Coffee.  Berna Spare MD 12/16/2015 4:43 PM    [facility] [address], [place], [place]  [address] Phone: [phone]; Fax: [phone]

## 2015-12-16 NOTE — Patient Instructions (Signed)
Medication Instructions:  Your physician recommends that you continue on your current medications as directed. Please refer to the Current Medication list given to you today.  Labwork: None   Testing/Procedures: Your physician has recommended that you wear a holter monitor. Holter monitors are medical devices that record the heart's electrical activity. Doctors most often use these monitors to diagnose arrhythmias. Arrhythmias are problems with the speed or rhythm of the heartbeat. The monitor is a small, portable device. You can wear one while you do your normal daily activities. This is usually used to diagnose what is causing palpitations/syncope (passing out). 18 HOUR MONITOR   Follow-Up: Your physician wants you to follow-up in: 6 month ov with Dr Johann Capers will receive a reminder letter in the mail two months in advance. If you don't receive a letter, please call our office to schedule the follow-up appointment.  If you need a refill on your cardiac medications before your next appointment, please call your pharmacy.

## 2015-12-21 ENCOUNTER — Ambulatory Visit (INDEPENDENT_AMBULATORY_CARE_PROVIDER_SITE_OTHER): Payer: Medicare Other

## 2015-12-21 DIAGNOSIS — R011 Cardiac murmur, unspecified: Secondary | ICD-10-CM

## 2015-12-21 DIAGNOSIS — R002 Palpitations: Secondary | ICD-10-CM

## 2015-12-29 ENCOUNTER — Telehealth: Payer: Self-pay | Admitting: Cardiology

## 2015-12-29 NOTE — Telephone Encounter (Signed)
New message     Retuning a call to the nurse to get monitor results

## 2015-12-29 NOTE — Telephone Encounter (Signed)
Notified of 48 hr holter monitor results.

## 2016-02-22 ENCOUNTER — Other Ambulatory Visit: Payer: Self-pay | Admitting: Gastroenterology

## 2016-02-22 DIAGNOSIS — R1084 Generalized abdominal pain: Secondary | ICD-10-CM

## 2016-02-29 ENCOUNTER — Ambulatory Visit
Admission: RE | Admit: 2016-02-29 | Discharge: 2016-02-29 | Disposition: A | Discharge status: Home / Self Care | Payer: Medicare Other | Source: Ambulatory Visit | Attending: Gastroenterology | Admitting: Gastroenterology

## 2016-02-29 DIAGNOSIS — R1084 Generalized abdominal pain: Secondary | ICD-10-CM

## 2016-02-29 MED ORDER — IOPAMIDOL (ISOVUE-300) INJECTION 61%
100.0000 mL | Freq: Once | INTRAVENOUS | Status: AC | PRN
Start: 2016-02-29 — End: 2016-02-29
  Administered 2016-02-29: 100 mL via INTRAVENOUS

## 2016-03-10 ENCOUNTER — Ambulatory Visit (INDEPENDENT_AMBULATORY_CARE_PROVIDER_SITE_OTHER): Payer: Medicare Other | Admitting: Internal Medicine

## 2016-03-10 ENCOUNTER — Encounter: Payer: Self-pay | Admitting: Internal Medicine

## 2016-03-10 ENCOUNTER — Ambulatory Visit: Payer: Medicare Other | Admitting: Internal Medicine

## 2016-03-10 VITALS — BP 116/74 | HR 57 | Ht 70.0 in | Wt 161.0 lb

## 2016-03-10 DIAGNOSIS — R059 Cough, unspecified: Secondary | ICD-10-CM

## 2016-03-10 DIAGNOSIS — R05 Cough: Secondary | ICD-10-CM | POA: Diagnosis not present

## 2016-03-10 DIAGNOSIS — R918 Other nonspecific abnormal finding of lung field: Secondary | ICD-10-CM | POA: Diagnosis not present

## 2016-03-10 MED ORDER — PANTOPRAZOLE SODIUM 40 MG PO TBEC
40.0000 mg | DELAYED_RELEASE_TABLET | Freq: Every day | ORAL | Status: DC
Start: 2016-03-10 — End: 2018-08-26

## 2016-03-10 MED ORDER — FAMOTIDINE 20 MG PO TABS: ORAL_TABLET | ORAL | Status: DC

## 2016-03-10 NOTE — Progress Notes (Signed)
Subjective:    Patient ID: [name], female    DOB: 1951/12/05   MRN: [id number]    Brief patient profile:  50 yowf quit smoking 1979 s sequelae with sinus infections dating back to the 90's with fall> spring pnds s/p sinus surgery in 90's then sick p trip to [place] mid August 3 days after arrival with sinus pain bad cough rx by Ernesto Rutherford with nasty mucus (green) with abx cleared the mucus but worse cough dx as bronchitis > cxr > no acute changes > ct abn so referred by Dr Jackson Latino to pulmonary clinic 09/09/13 with dx of bronchiectasis 09/02/13 assoc with mpns    History of Present Illness  09/09/2013 1st [facility] office visit/ [name]  Chief Complaint  Patient presents with  . Pulmonary Consult    Referred per Dr [name] for eval of abnormal ct chest. Pt c/o dyspnea and pain between shoulder blades x 6 wks.    cp only centered in back not pleuritic but rather positional  Feels heaviness in chest worse first thing in am  Cough is better but still present,  Mostly dry hack and urge to clear throat day > night rec Pantoprazole (protonix) 40 mg   Take 30-60 min before first meal of the day and Pepcid 20 mg one bedtime until return to office - this is the best way to tell whether stomach acid is contributing to your problem.   GERD diet   10/08/2013 f/u ov/[name] re: cough/ sob ? UACS Chief Complaint  Patient presents with  . Followup with PFT    Back pain is unchanged since last visit. Cough has improved some. Her dyspnea is slightly improved.   although definitely better,  Does not feel any benefit from acid suppression or diet isues, thinks it's all weather/ season related. No more chest heaviness in ams rec Bronchiectasis =   you have scarring of your bronchial tubes  Ok to try off the acid suppression after 4 weeks of therapy to see what difference if any this makes and start back immediately if cough flares for any reason.   07/23/2014 f/u ov/[name] re: recurrent cough, did not  activate action plan  Chief Complaint  Patient presents with  . Acute Visit    Pt c/o trouble taking a deep breath in the morning and intermittnly throughout the day. C/o increase in work of breathing, dry cough and chest heaviness and mid upper back x 1 week.    onset 2 weeks abrupt sensation of palpitations "with skip beat" Then 1 week prior to OV  Sensation of not being able to take a deep breath rx with alb due to findings on pfts but not really much better and caused palp worse so not suing  No change ex tol/ no sob walking, no trouble sitting, sleeping   Problems with speaking with hoarseness, sob  Pain is midline has same pattern and characteristics as prev episodes  rec Pantoprazole (protonix) 40 mg   Take 30-60 min before first meal of the day and Pepcid 20 mg one bedtime until return to office   GERD diet  Best inhaler should you need one to use as needed is  xopenex 1-2 puffs every 4 hours and does not cause heart racing   03/10/2016  f/u ov/[name] re: mpns/ uacs Chief Complaint  Patient presents with  . Follow-up    Pt c/o hoarseness recently. She has to clear her throat often. She had recent ct abd/pelvis which noted her  MPN and she would like to discuss if there have been any changes.   cough had cleared on gerd rx but ran out and did not try otcs/ daytime urge to clear throat, worse with voice use s sob   No obvious day to day or daytime variabilty or excess / purulent secretions,   subjective wheeze overt sinus or hb symptoms. No unusual exp hx or h/o childhood pna/ asthma or knowledge of premature birth.  Sleeping ok without nocturnal  or early am exacerbation  of respiratory  c/o's or need for noct saba. Also denies any obvious fluctuation of symptoms with  environmental changes or other aggravating or alleviating factors except as outlined above   Current Medications, Allergies, Complete Past Medical History, Past Surgical History, Family History, and Social History were  reviewed in [employer] record.  ROS  The following are not active complaints unless bolded sore throat, dysphagia, dental problems, itching, sneezing,  nasal congestion or excess/ purulent secretions, ear ache,   fever, chills, sweats, unintended wt loss, pleuritic or exertional cp, hemoptysis,  orthopnea pnd or leg swelling, presyncope, palpitations, heartburn, abdominal pain, anorexia, nausea, vomiting, diarrhea  or change in bowel or urinary habits, change in stools or urine, dysuria,hematuria,  rash, arthralgias and back pain, visual complaints, headache, numbness weakness or ataxia or problems with walking or coordination,  change in mood/affect or memory.              Objective:   Physical Exam  07/23/2014        160  > 03/10/2016   161     10/08/13 160 lb (72.576 kg)  09/09/13 162 lb 12.8 oz (73.846 kg)  04/14/13 161 lb (73.029 kg)        amb wf      HEENT: nl dentition, turbinates, and orophanx. Nl external ear canals without cough reflex   NECK :  without JVD/Nodes/TM/ nl carotid upstrokes bilaterally   LUNGS: no acc muscle use, clear to A and P bilaterally without cough on insp or exp maneuvers   CV:  RRR  no s3 or murmur or increase in P2, no edema   ABD:  soft and nontender with nl excursion in the supine position. No bruits or organomegaly, bowel sounds nl  MS:  warm without deformities, calf tenderness, cyanosis or clubbing  SKIN: warm and dry without lesions    NEURO:  alert, approp, no deficits            I personally reviewed images and agree with radiology impression as follows:  CT abd:  02/29/16 Lower chest: In the inferior aspect of the lingula and anterior left lower lobe, there are multiple small indeterminate pulmonary nodules. Largest has mean diameter of 6 mm on image 7 of series 5. These are new since 2010 exam                  Assessment & Plan:

## 2016-03-10 NOTE — Patient Instructions (Signed)
Pantoprazole (protonix) 40 mg   Take 30-60 min before first meal of the day and Pepcid 20 mg one bedtime until resolves for a week  GERD (REFLUX)  is an extremely common cause of respiratory symptoms, many times with no significant heartburn at all.    It can be treated with medication, but also with lifestyle changes including avoidance of late meals, excessive alcohol, smoking cessation, and avoid fatty foods, chocolate, peppermint, colas, red wine, and acidic juices such as orange juice.  NO MINT OR MENTHOL PRODUCTS SO NO COUGH DROPS  USE SUGARLESS CANDY INSTEAD (jolley ranchers or Stover's)  NO OIL BASED VITAMINS - use powdered substitutes.     We will call you to set up follow CT chest in Sept 2017 and call you with the results  Pulmonary follow up is as needed

## 2016-03-11 NOTE — Assessment & Plan Note (Signed)
-   recurrence off gerd rx 07/23/2014 > rec resume rx - recurrence off gerd rx 03/10/2016 > rec resume rx   This is classic  Upper airway cough syndrome, so named because it's frequently impossible to sort out how much is  CR/sinusitis with freq throat clearing (which can be related to primary GERD)   vs  causing  secondary (" extra esophageal")  GERD from wide swings in gastric pressure that occur with throat clearing, often  promoting self use of mint and menthol lozenges that reduce the lower esophageal sphincter tone and exacerbate the problem further in a cyclical fashion.   These are the same pts (now being labeled as having "irritable larynx syndrome" by some cough centers) who not infrequently have a history of having failed to tolerate ace inhibitors,  dry powder inhalers or biphosphonates or report having atypical reflux symptoms that don't respond to standard doses of PPI , and are easily confused as having aecopd or asthma flares by even experienced allergists/ pulmonologists.   Reviewed rx/ f/u if not better back on rx and with elimination of cyclical urge to clear throat with use of hard rock candy. See avs

## 2016-03-11 NOTE — Assessment & Plan Note (Signed)
-   See CT chest 2006 and 09/02/13 1. No acute cardiopulmonary abnormalities.  2. Spectrum of findings within the lungs, including peripheral  tree-in-bud nodularity and bronchiectasis, favor sequelae of chronic  indolent/atypical infection. 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  3. Small pericardial effusion - CT abd 02/29/16 Lower chest: In the inferior aspect of the lingula and anterior left lower lobe, there are multiple small indeterminate pulmonary nodules. Largest has mean diameter of 6 mm on image 7 of series 5. These are new since 2010 exam> repeat 08/31/16 as ct Chest  This is an extremely common benign condition   and does not warrant aggressive eval/ rx at this point unless there is a clinical correlation suggesting unaddressed pulmonary infection (purulent sputum, night sweats, unintended wt loss, doe).   15/54m ov discussion:  Discussed in detail all the  indications, usual  risks and alternatives  relative to the benefits with patient who agrees to proceed with conservative f/u with CT in 6 m per rad recs

## 2016-06-08 ENCOUNTER — Other Ambulatory Visit: Payer: Self-pay | Admitting: Rehabilitation

## 2016-06-08 DIAGNOSIS — M5416 Radiculopathy, lumbar region: Secondary | ICD-10-CM

## 2016-06-17 ENCOUNTER — Ambulatory Visit
Admission: RE | Admit: 2016-06-17 | Discharge: 2016-06-17 | Disposition: A | Discharge status: Home / Self Care | Payer: Medicare Other | Source: Ambulatory Visit | Attending: Rehabilitation | Admitting: Rehabilitation

## 2016-06-17 DIAGNOSIS — M5416 Radiculopathy, lumbar region: Secondary | ICD-10-CM

## 2016-06-17 MED ORDER — GADOBENATE DIMEGLUMINE 529 MG/ML IV SOLN
15.0000 mL | Freq: Once | INTRAVENOUS | Status: AC | PRN
  Administered 2016-06-17: 15 mL via INTRAVENOUS

## 2016-07-17 ENCOUNTER — Other Ambulatory Visit: Payer: Self-pay | Admitting: Internal Medicine

## 2016-07-17 DIAGNOSIS — R918 Other nonspecific abnormal finding of lung field: Secondary | ICD-10-CM

## 2016-08-21 ENCOUNTER — Other Ambulatory Visit: Payer: Medicare Other

## 2016-09-06 DIAGNOSIS — I358 Other nonrheumatic aortic valve disorders: Secondary | ICD-10-CM | POA: Insufficient documentation

## 2016-10-11 ENCOUNTER — Telehealth (INDEPENDENT_AMBULATORY_CARE_PROVIDER_SITE_OTHER): Payer: Self-pay

## 2016-10-12 NOTE — Telephone Encounter (Signed)
See note from pt

## 2016-10-12 NOTE — Telephone Encounter (Signed)
I called patient. She stated she wants records relating to her hip.  I told her that copies would be ready at the front desk for her to pick up tomorrow.

## 2016-10-24 ENCOUNTER — Inpatient Hospital Stay: Admission: RE | Admit: 2016-10-24 | Payer: Medicare Other | Source: Ambulatory Visit

## 2016-12-25 ENCOUNTER — Telehealth: Payer: Self-pay | Admitting: Internal Medicine

## 2016-12-25 DIAGNOSIS — R911 Solitary pulmonary nodule: Secondary | ICD-10-CM

## 2016-12-25 NOTE — Telephone Encounter (Signed)
Yes - prefer Ct in am and ov p lunch to let radiologist see it first

## 2016-12-25 NOTE — Telephone Encounter (Signed)
MW  Please Advise-  The last time you saw this pt. You wanted her to have a ct scan in Sept. She canceled the scan and now she is ready to have it rescheduled but she is unsure if she should have a follow up appointment with you after the scan.   Patient Instructions by Tanda Rockers, MD at 03/10/2016 4:51 PM   Author: Tanda Rockers, MD Author Type: Physician Filed: 03/10/2016 4:55 PM  Note Status: Signed Cosign: Cosign Not Required Encounter Date: 03/10/2016 4:51 PM  Editor: Tanda Rockers, MD (Physician)    Pantoprazole (protonix) 40 mg   Take 30-60 min before first meal of the day and Pepcid 20 mg one bedtime until resolves for a week  GERD (REFLUX)  is an extremely common cause of respiratory symptoms, many times with no significant heartburn at all.    It can be treated with medication, but also with lifestyle changes including avoidance of late meals, excessive alcohol, smoking cessation, and avoid fatty foods, chocolate, peppermint, colas, red wine, and acidic juices such as orange juice.  NO MINT OR MENTHOL PRODUCTS SO NO COUGH DROPS  USE SUGARLESS CANDY INSTEAD (jolley ranchers or Stover's)  NO OIL BASED VITAMINS - use powdered substitutes.     We will call you to set up follow CT chest in Sept 2017 and call you with the results  Pulmonary follow up is as needed

## 2016-12-26 NOTE — Telephone Encounter (Signed)
lmtcb x1 for pt. 

## 2016-12-29 NOTE — Telephone Encounter (Signed)
Spoke with pt. She is aware of MW's response. Order has been placed for CT as the one in the system is close to being expired. Nothing further was needed.

## 2017-01-11 ENCOUNTER — Ambulatory Visit (INDEPENDENT_AMBULATORY_CARE_PROVIDER_SITE_OTHER)
Admission: RE | Admit: 2017-01-11 | Discharge: 2017-01-11 | Disposition: A | Discharge status: Home / Self Care | Payer: Medicare Other | Source: Ambulatory Visit | Attending: Internal Medicine | Admitting: Internal Medicine

## 2017-01-11 DIAGNOSIS — R911 Solitary pulmonary nodule: Secondary | ICD-10-CM | POA: Diagnosis not present

## 2017-01-12 ENCOUNTER — Ambulatory Visit: Payer: Medicare Other | Admitting: Internal Medicine

## 2017-01-12 ENCOUNTER — Telehealth: Payer: Self-pay | Admitting: Internal Medicine

## 2017-01-12 NOTE — Progress Notes (Signed)
Spoke with pt and notified of results per Dr. Wert. Pt verbalized understanding and denied any questions. 

## 2017-01-12 NOTE — Telephone Encounter (Signed)
Spoke with pt. Advised that the 2 prescriptions that MW gave her were Pepcid and Pantoprazole. She has not been seen since 03/10/16. Advised her that MW would more than likely want her to have an appointment for these refills. She declined an appointment at this time. Pt has been made aware that she can get Pepcid OTC. Pt will contact her PCP to see if they would refill Pantoprazole. Nothing further was needed.

## 2017-03-15 ENCOUNTER — Ambulatory Visit
Admission: RE | Admit: 2017-03-15 | Discharge: 2017-03-15 | Disposition: A | Discharge status: Home / Self Care | Payer: Medicare Other | Source: Ambulatory Visit | Attending: Gastroenterology | Admitting: Gastroenterology

## 2017-03-15 ENCOUNTER — Other Ambulatory Visit: Payer: Self-pay | Admitting: Gastroenterology

## 2017-03-15 DIAGNOSIS — R1084 Generalized abdominal pain: Secondary | ICD-10-CM

## 2017-08-17 ENCOUNTER — Ambulatory Visit (INDEPENDENT_AMBULATORY_CARE_PROVIDER_SITE_OTHER): Payer: Medicare Other | Admitting: Orthopedic Surgery

## 2017-08-17 ENCOUNTER — Ambulatory Visit (INDEPENDENT_AMBULATORY_CARE_PROVIDER_SITE_OTHER): Payer: Medicare Other

## 2017-08-17 ENCOUNTER — Encounter (INDEPENDENT_AMBULATORY_CARE_PROVIDER_SITE_OTHER): Payer: Self-pay | Admitting: Orthopedic Surgery

## 2017-08-17 DIAGNOSIS — M25551 Pain in right hip: Secondary | ICD-10-CM | POA: Diagnosis not present

## 2017-08-17 DIAGNOSIS — M25562 Pain in left knee: Secondary | ICD-10-CM | POA: Diagnosis not present

## 2017-08-17 NOTE — Progress Notes (Signed)
Office Visit Note   Patient: [name]           Date of Birth: Aug 04, 1951           MRN: [id number] Visit Date: 08/17/2017 Requested by: Nolene Ebbs, MD [address] [place], [place] [address] PCP: Nolene Ebbs, MD  Subjective: Chief Complaint  Patient presents with  . Right Hip - Pain  . Left Knee - Pain    HPI: [name] is a 66 year old patient with right hip pain.  She's had previous iliotibial band release.  She's had some pain develop in that hip over the past several years.  No interval history of injury.  Denies any groin pain.  She has seen a physician in [place] he said there is really not much to do.  She also describes left knee pain.  This is intense left anterior knee pain particularly when she is kneeling and praying.  She has no mechanical symptoms or effusion.  She takes tramadol.              ROS: All systems reviewed are negative as they relate to the chief complaint within the history of present illness.  Patient denies  fevers or chills.   Assessment & Plan: Visit Diagnoses:  1. Acute pain of left knee   2. Pain in right hip     Plan: Impression is likely aggravation of some of the sensory nerves on the anterior aspect of the knee.  Structurally her knee looks to be intact.  In regards to the right hip I agree there is not much to be done.  I would have her try some anti-inflammatory topical cream samples.  Follow-up with me as needed  Follow-Up Instructions: Return if symptoms worsen or fail to improve.   Orders:  Orders Placed This Encounter  Procedures  . XR KNEE 3 VIEW LEFT  . XR HIP UNILAT W OR W/O PELVIS 2-3 VIEWS RIGHT   No orders of the defined types were placed in this encounter.     Procedures: No procedures performed   Clinical Data: No additional findings.  Objective: Vital Signs: There were no vitals taken for this visit.  Physical Exam:   Constitutional: Patient appears well-developed HEENT:  Head:  Normocephalic Eyes:EOM are normal Neck: Normal range of motion Cardiovascular: Normal rate Pulmonary/chest: Effort normal Neurologic: Patient is alert Skin: Skin is warm Psychiatric: Patient has normal mood and affect    Ortho Exam: Orthopedic exam demonstrates discrete tenderness over the trochanteric region.  No groin pain with internal/external rotation of the leg.  No nerve root tension signs.  Left knee is examined.  Full active and passive range of motion is noted.  She has pretty superficial discrete point tenderness along the anterolateral aspect of the patella.  No evidence of synovitis or fluid collection or prepatellar bursitis noted.  Extensor mechanism is intact and nontender.  Collateral and cruciate ligaments are stable.  Specialty Comments:  No specialty comments available.  Imaging: Xr Hip Unilat W Or W/o Pelvis 2-3 Views Right  Result Date: 08/17/2017 AP pelvis lateral right hip reviewed.  No degenerative joint disease is noted in the hip joint.  No fractures present.  No other abnormal soft tissue calcifications.  Normal hip  Xr Knee 3 View Left  Result Date: 08/17/2017 AP lateral merchant left knee reviewed.  Joint spaces maintained in all 3 compartments.  No significant degenerative joint disease is noted.  No effusion is present.  No other abnormal calcifications present.  PMFS History: Patient Active Problem List   Diagnosis Date Noted  . Cough 09/11/2013  . Multiple pulmonary nodules assoc with bronchiectasis 09/11/2013  . Osteoarthritis 03/25/2012  . Heart murmur   . Dyspnea   . SOB (shortness of breath)   . Lightheadedness   . Chest pain   . Palpitation    Past Medical History:  Diagnosis Date  . Arthritis    lower back  . Carpal tunnel syndrome of right wrist   . Chest pain    WHEN SLEEPING ON LEFT SIDE  . Complication of anesthesia    felt funny afterward, sleep did help some  . Dyspnea   . Dysrhythmia    rapid heart rate at night  .  Headache(784.0)    migraines  . Heart murmur   . Hepatitis 1976   hep A  . Lightheadedness   . Palpitation   . SOB (shortness of breath)     Family History  Problem Relation Age of Onset  . Kidney failure Mother   . Lung cancer Father        smoked  . Emphysema Father        smoked    Past Surgical History:  Procedure Laterality Date  . ABDOMINAL HYSTERECTOMY    . BACK SURGERY     x 2  . COLONOSCOPY    . ELBOW SURGERY Right 2008   tennis elbow  . EXCISION/RELEASE BURSA HIP Right 02/17/2014   Procedure: EXCISION/RELEASE BURSA HIP;  Surgeon: Meredith Pel, MD;  Location: [facility];  Service: Orthopedics;  Laterality: Right;  RIGHT HIP TENSOR FASCIAL LATERAL RELEASE.  Marland Kitchen NASAL SINUS SURGERY    . PARTIAL HYSTERECTOMY     Social History   Occupational History  . Pastry Chef    Social History Main Topics  . Smoking status: Former Smoker    Packs/day: 0.25    Years: 9.00    Types: Cigarettes    Quit date: 04/14/1978  . Smokeless tobacco: Never Used  . Alcohol use No  . Drug use: No  . Sexual activity: Not on file

## 2017-08-27 ENCOUNTER — Telehealth (INDEPENDENT_AMBULATORY_CARE_PROVIDER_SITE_OTHER): Payer: Self-pay | Admitting: Orthopedic Surgery

## 2017-08-27 NOTE — Telephone Encounter (Signed)
Patient called saying that she is still in a lot of pain in her left leg. Needed to know if she should make an appointment or if she needed to see someone else. CB # [phone]

## 2017-08-27 NOTE — Telephone Encounter (Signed)
Please advise thanks.

## 2017-08-28 NOTE — Telephone Encounter (Signed)
IC s/w patient. She opted to schedule appt with Dr Marlou Sa She will be seen on Friday a.m.

## 2017-08-28 NOTE — Telephone Encounter (Signed)
Left knee looked like bruised superficial saphenous nerve - imo injection into the knee not indicated - if her sxs are different and it seems more back related we can see her and try meds or scan but for the problem I saw her for last clinic visit dont have much to offer unless she wants to try some pennsaid samples pls call thx

## 2017-08-31 ENCOUNTER — Ambulatory Visit (INDEPENDENT_AMBULATORY_CARE_PROVIDER_SITE_OTHER): Payer: Medicare Other | Admitting: Orthopedic Surgery

## 2017-10-18 ENCOUNTER — Ambulatory Visit (INDEPENDENT_AMBULATORY_CARE_PROVIDER_SITE_OTHER): Payer: Medicare Other | Admitting: Orthopedic Surgery

## 2017-11-07 ENCOUNTER — Encounter (INDEPENDENT_AMBULATORY_CARE_PROVIDER_SITE_OTHER): Payer: Self-pay | Admitting: Orthopedic Surgery

## 2017-11-07 ENCOUNTER — Ambulatory Visit (INDEPENDENT_AMBULATORY_CARE_PROVIDER_SITE_OTHER): Payer: Medicare Other | Admitting: Orthopedic Surgery

## 2017-11-07 ENCOUNTER — Ambulatory Visit (INDEPENDENT_AMBULATORY_CARE_PROVIDER_SITE_OTHER): Payer: Medicare Other

## 2017-11-07 DIAGNOSIS — M79644 Pain in right finger(s): Secondary | ICD-10-CM | POA: Diagnosis not present

## 2017-11-07 DIAGNOSIS — M79645 Pain in left finger(s): Secondary | ICD-10-CM

## 2017-11-11 NOTE — Progress Notes (Signed)
Office Visit Note   Patient: [name]           Date of Birth: 1950-12-17           MRN: [id number] Visit Date: 11/07/2017 Requested by: Nolene Ebbs, MD [address] [place], [place] [address] PCP: Nolene Ebbs, MD  Subjective: Chief Complaint  Patient presents with  . Hand Pain    left thumb and right ring finger    HPI: [name] is a patient with left thumb and right ring finger pain.  States she has constant pain at the base of her left thumb.  A lot of aching and pain with pinching.  She states that she jammed her right ring finger 2 months ago and it has not improved.  She can bend it at the joint but it is painful.  She had an injection in her neck do earlier this year.  The injury to the right ring finger was an axial loading injury              ROS: All systems reviewed are negative as they relate to the chief complaint within the history of present illness.  Patient denies  fevers or chills.   Assessment & Plan: Visit Diagnoses:  1. Thumb pain, left   2. 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  Right ring finger is normal on exam but has mild radiographic abnormality of the distal aspect of the proximal phalanx.  That needs repeat radiographs in 4 months.  I do want to try topical anti-inflammatory for thumb and the ring finger.  I will see her back in 4 months for clinical recheck and repeat radiographs on the ring finger  Follow-Up Instructions: Return in about 6 months (around 05/07/2018).   Orders:  Orders Placed This Encounter  Procedures  . XR Finger Thumb Left  . XR Finger Ring Right   No orders of the defined types were placed in this encounter.     Procedures: No procedures performed   Clinical Data: No additional findings.  Objective: Vital Signs: There were no vitals taken for this visit.  Physical Exam:   Constitutional: Patient appears well-developed HEENT:  Head: Normocephalic Eyes:EOM  are normal Neck: Normal range of motion Cardiovascular: Normal rate Pulmonary/chest: Effort normal Neurologic: Patient is alert Skin: Skin is warm Psychiatric: Patient has normal mood and affect    Ortho Exam: Orthopedic exam on that left thumb demonstrates intact EPL FPL function with no hyperextension at the IP joint or MCP joint.  Positive grind test is present.  There is tenderness at the [facility] joint on the left compared to the right.  No first dorsal compartment tenderness is noted.  Wrist range of motion otherwise intact.  Right ring finger is examined.  No soft tissue fullness right versus left ring finger.  Collaterals are stable 0 and 30 degrees to ulnar and radial stress.  Patient has full extension against gravity with no lag at the PIP joint.  Patient also has full flexion.  Minimal if any soft tissue swelling is present around the PIP joint right versus left  Specialty Comments:  No specialty comments available.  Imaging: No results found.   PMFS History: Patient Active Problem List   Diagnosis Date Noted  . Cough 09/11/2013  . Multiple pulmonary nodules assoc with bronchiectasis 09/11/2013  . Osteoarthritis 03/25/2012  . Heart murmur   . Dyspnea   . SOB (shortness of breath)   .  Lightheadedness   . Chest pain   . Palpitation    Past Medical History:  Diagnosis Date  . Arthritis    lower back  . Carpal tunnel syndrome of right wrist   . Chest pain    WHEN SLEEPING ON LEFT SIDE  . Complication of anesthesia    felt funny afterward, sleep did help some  . Dyspnea   . Dysrhythmia    rapid heart rate at night  . Headache(784.0)    migraines  . Heart murmur   . Hepatitis 1976   hep A  . Lightheadedness   . Palpitation   . SOB (shortness of breath)     Family History  Problem Relation Age of Onset  . Kidney failure Mother   . Lung cancer Father        smoked  . Emphysema Father        smoked    Past Surgical History:  Procedure Laterality Date  .  ABDOMINAL HYSTERECTOMY    . BACK SURGERY     x 2  . COLONOSCOPY    . ELBOW SURGERY Right 2008   tennis elbow  . EXCISION/RELEASE BURSA HIP Right 02/17/2014   Procedure: EXCISION/RELEASE BURSA HIP;  Surgeon: Meredith Pel, MD;  Location: [facility];  Service: Orthopedics;  Laterality: Right;  RIGHT HIP TENSOR FASCIAL LATERAL RELEASE.  Marland Kitchen NASAL SINUS SURGERY    . PARTIAL HYSTERECTOMY     Social History   Occupational History  . Occupation: Radiographer, therapeutic  Tobacco Use  . Smoking status: Former Smoker    Packs/day: 0.25    Years: 9.00    Pack years: 2.25    Types: Cigarettes    Last attempt to quit: 04/14/1978    Years since quitting: 39.6  . Smokeless tobacco: Never Used  Substance and Sexual Activity  . Alcohol use: No  . Drug use: No  . Sexual activity: Not on file

## 2018-01-23 ENCOUNTER — Telehealth (INDEPENDENT_AMBULATORY_CARE_PROVIDER_SITE_OTHER): Payer: Self-pay | Admitting: Orthopedic Surgery

## 2018-01-23 NOTE — Telephone Encounter (Signed)
Ok for injection  

## 2018-01-23 NOTE — Telephone Encounter (Signed)
Ok for this? 

## 2018-01-23 NOTE — Telephone Encounter (Signed)
Dr Marlou Sa said ok injection. Can you please call her to schedule?

## 2018-01-23 NOTE — Telephone Encounter (Signed)
Patient called wanting to make an appointment to get a hand injection on the 25th, just wanted to make sure this was okay. CB # [phone]

## 2018-02-04 ENCOUNTER — Encounter (INDEPENDENT_AMBULATORY_CARE_PROVIDER_SITE_OTHER): Payer: Self-pay | Admitting: Orthopedic Surgery

## 2018-02-04 ENCOUNTER — Ambulatory Visit (INDEPENDENT_AMBULATORY_CARE_PROVIDER_SITE_OTHER): Payer: Medicare Other | Admitting: Orthopedic Surgery

## 2018-02-04 ENCOUNTER — Ambulatory Visit (INDEPENDENT_AMBULATORY_CARE_PROVIDER_SITE_OTHER): Payer: Medicare Other

## 2018-02-04 DIAGNOSIS — M19049 Primary osteoarthritis, unspecified hand: Secondary | ICD-10-CM

## 2018-02-05 ENCOUNTER — Encounter (INDEPENDENT_AMBULATORY_CARE_PROVIDER_SITE_OTHER): Payer: Self-pay | Admitting: Orthopedic Surgery

## 2018-02-05 DIAGNOSIS — M19042 Primary osteoarthritis, left hand: Secondary | ICD-10-CM

## 2018-02-05 MED ORDER — METHYLPREDNISOLONE ACETATE 40 MG/ML IJ SUSP
13.3300 mg | INTRAMUSCULAR | Status: AC | PRN
  Administered 2018-02-05: 13.33 mg via INTRA_ARTICULAR

## 2018-02-05 MED ORDER — BUPIVACAINE HCL 0.25 % IJ SOLN
0.3300 mL | INTRAMUSCULAR | Status: AC | PRN
  Administered 2018-02-05: .33 mL via INTRA_ARTICULAR

## 2018-02-05 MED ORDER — LIDOCAINE HCL 1 % IJ SOLN
3.0000 mL | INTRAMUSCULAR | Status: AC | PRN
  Administered 2018-02-05: 3 mL

## 2018-02-05 NOTE — Progress Notes (Signed)
Office Visit Note   Patient: [name]           Date of Birth: Dec 31, 1950           MRN: [id number] Visit Date: 02/04/2018 Requested by: Nolene Ebbs, MD [address] [place],  [address] PCP: Nolene Ebbs, MD  Subjective: Chief Complaint  Patient presents with  . Left Hand - Follow-up, Pain    HPI: [name] is a patient with left hand pain localizing to the [facility] joint of the thumb.  She has tried topical anti-inflammatories without much relief.  Describes pain with pinching which interferes with her ability to cook.              ROS: All systems reviewed are negative as they relate to the chief complaint within the history of present illness.  Patient denies  fevers or chills.   Assessment & Plan: Visit Diagnoses:  1. CMC arthritis     Plan: Impression is CMC arthritis left hand.  Plan is left thumb injection under fluoroscopy which is performed today.  Patient tolerated the procedure well.  I will see her back as needed.  Follow-Up Instructions: No Follow-up on file.   Orders:  Orders Placed This Encounter  Procedures  . XR C-ARM NO REPORT   No orders of the defined types were placed in this encounter.     Procedures: Small Joint Inj: L thumb CMC on 02/05/2018 4:14 PM Indications: pain Details: 25 G needle, fluoroscopy-guided radial approach  Spinal Needle: No  Medications: 3 mL lidocaine 1 %; 0.33 mL bupivacaine 0.25 %; 13.33 mg methylPREDNISolone acetate 40 MG/ML Outcome: tolerated well, no immediate complications Procedure, treatment alternatives, risks and benefits explained, specific risks discussed. Consent was given by the patient. Immediately prior to procedure a time out was called to verify the correct patient, procedure, equipment, support staff and site/side marked as required. Patient was prepped and draped in the usual sterile fashion.       Clinical Data: No additional findings.  Objective: Vital Signs: There were no  vitals taken for this visit.  Physical Exam:   Constitutional: Patient appears well-developed HEENT:  Head: Normocephalic Eyes:EOM are normal Neck: Normal range of motion Cardiovascular: Normal rate Pulmonary/chest: Effort normal Neurologic: Patient is alert Skin: Skin is warm Psychiatric: Patient has normal mood and affect    Ortho Exam: Orthopedic exam demonstrates good EPL FPL function in the left hand with positive grind test.  Grip strength is good but pinch strength is painful on the left.  No hyperextension deformity at the MCP joint.  Specialty Comments:  No specialty comments available.  Imaging: No results found.   PMFS History: Patient Active Problem List   Diagnosis Date Noted  . Cough 09/11/2013  . Multiple pulmonary nodules assoc with bronchiectasis 09/11/2013  . Osteoarthritis 03/25/2012  . Heart murmur   . Dyspnea   . SOB (shortness of breath)   . Lightheadedness   . Chest pain   . Palpitation    Past Medical History:  Diagnosis Date  . Arthritis    lower back  . Carpal tunnel syndrome of right wrist   . Chest pain    WHEN SLEEPING ON LEFT SIDE  . Complication of anesthesia    felt funny afterward, sleep did help some  . Dyspnea   . Dysrhythmia    rapid heart rate at night  . Headache(784.0)    migraines  . Heart murmur   . Hepatitis 1976   hep A  .  Lightheadedness   . Palpitation   . SOB (shortness of breath)     Family History  Problem Relation Age of Onset  . Kidney failure Mother   . Lung cancer Father        smoked  . Emphysema Father        smoked    Past Surgical History:  Procedure Laterality Date  . ABDOMINAL HYSTERECTOMY    . BACK SURGERY     x 2  . COLONOSCOPY    . ELBOW SURGERY Right 2008   tennis elbow  . EXCISION/RELEASE BURSA HIP Right 02/17/2014   Procedure: EXCISION/RELEASE BURSA HIP;  Surgeon: Meredith Pel, MD;  Location: [facility];  Service: Orthopedics;  Laterality: Right;  RIGHT HIP TENSOR FASCIAL  LATERAL RELEASE.  Marland Kitchen NASAL SINUS SURGERY    . PARTIAL HYSTERECTOMY     Social History   Occupational History  . Occupation: Radiographer, therapeutic  Tobacco Use  . Smoking status: Former Smoker    Packs/day: 0.25    Years: 9.00    Pack years: 2.25    Types: Cigarettes    Last attempt to quit: 04/14/1978    Years since quitting: 39.8  . Smokeless tobacco: Never Used  Substance and Sexual Activity  . Alcohol use: No  . Drug use: No  . Sexual activity: Not on file

## 2018-02-22 DIAGNOSIS — J302 Other seasonal allergic rhinitis: Secondary | ICD-10-CM | POA: Insufficient documentation

## 2018-02-22 DIAGNOSIS — R519 Headache, unspecified: Secondary | ICD-10-CM | POA: Insufficient documentation

## 2018-02-22 DIAGNOSIS — F5101 Primary insomnia: Secondary | ICD-10-CM | POA: Insufficient documentation

## 2018-04-08 ENCOUNTER — Encounter (HOSPITAL_COMMUNITY): Payer: Self-pay | Admitting: Emergency Medicine

## 2018-04-08 ENCOUNTER — Emergency Department (HOSPITAL_COMMUNITY)
Admission: EM | Admit: 2018-04-08 | Discharge: 2018-04-08 | Disposition: A | Discharge status: Home / Self Care | Payer: Medicare Other | Attending: Emergency Medicine | Admitting: Emergency Medicine

## 2018-04-08 DIAGNOSIS — R197 Diarrhea, unspecified: Secondary | ICD-10-CM | POA: Insufficient documentation

## 2018-04-08 DIAGNOSIS — R112 Nausea with vomiting, unspecified: Secondary | ICD-10-CM | POA: Insufficient documentation

## 2018-04-08 DIAGNOSIS — Z5321 Procedure and treatment not carried out due to patient leaving prior to being seen by health care provider: Secondary | ICD-10-CM | POA: Insufficient documentation

## 2018-04-08 NOTE — ED Triage Notes (Signed)
Patient here from home with complaints of flu like symptoms. Nausea, vomiting, diarrhea since Saturday.  States "I cannot walk or sit in the chair".

## 2018-05-14 ENCOUNTER — Telehealth (INDEPENDENT_AMBULATORY_CARE_PROVIDER_SITE_OTHER): Payer: Self-pay | Admitting: Orthopedic Surgery

## 2018-05-14 NOTE — Telephone Encounter (Signed)
Patient requesting left hand cortisone injection at her appt made for 6/12. Her last injection was 02/04/18.

## 2018-05-22 ENCOUNTER — Encounter (INDEPENDENT_AMBULATORY_CARE_PROVIDER_SITE_OTHER): Payer: Self-pay | Admitting: Orthopedic Surgery

## 2018-05-22 ENCOUNTER — Ambulatory Visit (INDEPENDENT_AMBULATORY_CARE_PROVIDER_SITE_OTHER): Payer: Medicare Other | Admitting: Orthopedic Surgery

## 2018-05-22 DIAGNOSIS — M19042 Primary osteoarthritis, left hand: Secondary | ICD-10-CM | POA: Diagnosis not present

## 2018-05-22 DIAGNOSIS — M19049 Primary osteoarthritis, unspecified hand: Secondary | ICD-10-CM

## 2018-05-22 MED ORDER — LIDOCAINE HCL 1 % IJ SOLN
3.0000 mL | INTRAMUSCULAR | Status: AC | PRN
  Administered 2018-05-22: 3 mL

## 2018-05-22 MED ORDER — BUPIVACAINE HCL 0.25 % IJ SOLN
0.3300 mL | INTRAMUSCULAR | Status: AC | PRN
  Administered 2018-05-22: .33 mL via INTRA_ARTICULAR

## 2018-05-22 MED ORDER — METHYLPREDNISOLONE ACETATE 40 MG/ML IJ SUSP
13.3300 mg | INTRAMUSCULAR | Status: AC | PRN
  Administered 2018-05-22: 13.33 mg via INTRA_ARTICULAR

## 2018-05-22 NOTE — Progress Notes (Signed)
   Procedure Note  Patient: [name]             Date of Birth: [birth year]           MRN: [id number]             Visit Date: 05/22/2018  Procedures: Visit Diagnoses: [facility] arthritis  Small Joint Inj: L thumb [place] on 05/22/2018 12:37 PM Indications: pain Details: 25 G needle, fluoroscopy-guided radial approach  Spinal Needle: No  Medications: 3 mL lidocaine 1 %; 0.33 mL bupivacaine 0.25 %; 13.33 mg methylPREDNISolone acetate 40 MG/ML Outcome: tolerated well, no immediate complications Procedure, treatment alternatives, risks and benefits explained, specific risks discussed. Consent was given by the patient. Immediately prior to procedure a time out was called to verify the correct patient, procedure, equipment, support staff and site/side marked as required. Patient was prepped and draped in the usual sterile fashion.     Patient with left thumb CMC arthritis presents for injection.  Discussed potential referral for surgery if these stop helping

## 2018-07-01 ENCOUNTER — Telehealth (INDEPENDENT_AMBULATORY_CARE_PROVIDER_SITE_OTHER): Payer: Self-pay | Admitting: Orthopedic Surgery

## 2018-07-01 NOTE — Telephone Encounter (Signed)
IC s/w patient. She states it was not Dr Burney Gauze that they discussed at last [place]. She states she will discuss try to recall who it was that she wanted to be referred to and call us back to let us know

## 2018-07-01 NOTE — Telephone Encounter (Signed)
Patient would like to know the name of the arthritis doctor Dr. Marlou Sa mentioned at her last visit. She said injection in her thumb did not help. CB # [phone]

## 2018-07-01 NOTE — Telephone Encounter (Signed)
Please advise thanks.

## 2018-07-01 NOTE — Telephone Encounter (Signed)
Refer her to matt weingold for cmc arthroplasty thx

## 2018-07-10 ENCOUNTER — Ambulatory Visit: Payer: Medicare Other | Admitting: Internal Medicine

## 2018-08-02 ENCOUNTER — Ambulatory Visit: Payer: Medicare Other | Admitting: Internal Medicine

## 2018-08-26 ENCOUNTER — Ambulatory Visit (INDEPENDENT_AMBULATORY_CARE_PROVIDER_SITE_OTHER): Payer: Medicare Other | Admitting: Internal Medicine

## 2018-08-26 ENCOUNTER — Encounter: Payer: Self-pay | Admitting: Internal Medicine

## 2018-08-26 VITALS — BP 120/68 | HR 77

## 2018-08-26 DIAGNOSIS — R059 Cough, unspecified: Secondary | ICD-10-CM

## 2018-08-26 DIAGNOSIS — R918 Other nonspecific abnormal finding of lung field: Secondary | ICD-10-CM | POA: Diagnosis not present

## 2018-08-26 DIAGNOSIS — R05 Cough: Secondary | ICD-10-CM

## 2018-08-26 MED ORDER — FAMOTIDINE 20 MG PO TABS
ORAL_TABLET | ORAL | 11 refills | Status: DC
Start: 2018-08-26 — End: 2018-11-13

## 2018-08-26 MED ORDER — PANTOPRAZOLE SODIUM 40 MG PO TBEC
40.0000 mg | DELAYED_RELEASE_TABLET | Freq: Every day | ORAL | 2 refills | Status: DC
Start: 2018-08-26 — End: 2018-11-13

## 2018-08-26 NOTE — Patient Instructions (Addendum)
Please see patient coordinator before you leave today  to schedule ct chest and sinus   Pantoprazole (protonix) 40 mg   Take  30-60 min before first meal of the day and Pepcid (famotidine)  20 mg one @  bedtime until cough is gone for at least a week with the need for cough medications    For cough > delsym 2 tsp every 12 hours as needed    GERD (REFLUX)  is an extremely common cause of respiratory symptoms just like yours , many times with no obvious heartburn at all.    It can be treated with medication, but also with lifestyle changes including elevation of the head of your bed (ideally with 6 inch  bed blocks),  Smoking cessation, avoidance of late meals, excessive alcohol, and avoid fatty foods, chocolate, peppermint, colas, red wine, and acidic juices such as orange juice.  NO MINT OR MENTHOL PRODUCTS SO NO COUGH DROPS  USE SUGARLESS CANDY INSTEAD (Jolley ranchers or Stover's or Life Savers) or even ice chips will also do - the key is to swallow to prevent all throat clearing. NO OIL BASED VITAMINS - use powdered substitutes.  Pulmonary follow up is as needed

## 2018-08-26 NOTE — Progress Notes (Signed)
Subjective:   Patient ID: [name], female    DOB: October 10, 1951   MRN: [id number]    Brief patient profile:  59  yowf quit smoking 1979 s sequelae with sinus infections dating back to the 90's with fall> spring pnds s/p sinus surgery in 90's then sick p trip to [place] mid August 3 days after arrival with sinus pain bad cough rx by Ernesto Rutherford with nasty mucus (green) with abx cleared the mucus but worse cough dx as bronchitis > cxr > no acute changes > ct abn so referred by Dr Jackson Latino to pulmonary clinic 09/09/13 with dx of bronchiectasis 09/02/13 assoc with mpns    History of Present Illness  09/09/2013 1st [facility] office visit/ [name]  Chief Complaint  Patient presents with  . Pulmonary Consult    Referred per Dr [name] for eval of abnormal ct chest. Pt c/o dyspnea and pain between shoulder blades x 6 wks.    cp only centered in back not pleuritic but rather positional  Feels heaviness in chest worse first thing in am  Cough is better but still present,  Mostly dry hack and urge to clear throat day > night rec Pantoprazole (protonix) 40 mg   Take 30-60 min before first meal of the day and Pepcid 20 mg one bedtime until return to office - this is the best way to tell whether stomach acid is contributing to your problem.   GERD diet   10/08/2013 f/u ov/Calil Amor re: cough/ sob ? UACS Chief Complaint  Patient presents with  . Followup with PFT    Back pain is unchanged since last visit. Cough has improved some. Her dyspnea is slightly improved.   although definitely better,  Does not feel any benefit from acid suppression or diet isues, thinks it's all weather/ season related. No more chest heaviness in ams rec Bronchiectasis =   you have scarring of your bronchial tubes  Ok to try off the acid suppression after 4 weeks of therapy to see what difference if any this makes and start back immediately if cough flares for any reason.   07/23/2014 f/u ov/Saleha Kalp re: recurrent cough, did not  activate action plan  Chief Complaint  Patient presents with  . Acute Visit    Pt c/o trouble taking a deep breath in the morning and intermittnly throughout the day. C/o increase in work of breathing, dry cough and chest heaviness and mid upper back x 1 week.    onset 2 weeks abrupt sensation of palpitations "with skip beat" Then 1 week prior to OV  Sensation of not being able to take a deep breath rx with alb due to findings on pfts but not really much better and caused palp worse so not suing  No change ex tol/ no sob walking, no trouble sitting, sleeping   Problems with speaking with hoarseness, sob  Pain is midline has same pattern and characteristics as prev episodes  rec Pantoprazole (protonix) 40 mg   Take 30-60 min before first meal of the day and Pepcid 20 mg one bedtime until return to office   GERD diet  Best inhaler should you need one to use as needed is  xopenex 1-2 puffs every 4 hours and does not cause heart racing   03/10/2016  f/u ov/Jakobi Thetford re: mpns/ uacs Chief Complaint  Patient presents with  . Follow-up    Pt c/o hoarseness recently. She has to clear her throat often. She had recent ct abd/pelvis which noted her  MPN and she would like to discuss if there have been any changes.   cough had cleared on gerd rx but ran out and did not try otcs/ daytime urge to clear throat, worse with voice use s sob  rec GERD diet   No change rx     08/26/2018  Acute/ extended ov/Jamien Casanova re: cough was resolved  off reflux meds until one month prior to OV ,  Started using some cbd pen  Chief Complaint  Patient presents with  . Acute Visit    Cough and hoarseness over the past few wks. She is coughing up some clear sputum.   Dyspnea:  Not limited by breathing from desired activities   Cough: daytime > noct/ no am flare/ min clear mucus/ pnds / worse off gerd rx  Sleeping: fine one pillow flat SABA use: none 02: none   No obvious day to day or daytime variability or assoc excess/  purulent sputum or mucus plugs or hemoptysis or cp or chest tightness, subjective wheeze or overt sinus or hb symptoms.   Sleeping as above  without nocturnal  or early am exacerbation  of respiratory  c/o's or need for noct saba. Also denies any obvious fluctuation of symptoms with weather or environmental changes or other aggravating or alleviating factors except as outlined above   No unusual exposure hx or h/o childhood pna/ asthma or knowledge of premature birth.  Current Allergies, Complete Past Medical History, Past Surgical History, Family History, and Social History were reviewed in [employer] record.  ROS  The following are not active complaints unless bolded Hoarseness, sore throat, dysphagia, dental problems, itching, sneezing,  nasal congestion or discharge of excess mucus or purulent secretions, ear ache,   fever, chills, sweats, unintended wt loss or wt gain, classically pleuritic or exertional cp,  orthopnea pnd or arm/hand swelling  or leg swelling, presyncope, palpitations, abdominal pain, anorexia, nausea, vomiting, diarrhea  or change in bowel habits or change in bladder habits, change in stools or change in urine, dysuria, hematuria,  rash, arthralgias, visual complaints, headache, numbness, weakness or ataxia or problems with walking or coordination,  change in mood or  memory.        Current Meds  Medication Sig  . ALPRAZolam (XANAX) 0.5 MG tablet Take 0.5 mg by mouth at bedtime.   Marland Kitchen atenolol (TENORMIN) 25 MG tablet TK 1 T PO QD.  . butalbital-acetaminophen-caffeine (FIORICET, ESGIC) 50-325-40 MG per tablet Take 1 tablet by mouth every 4 (four) hours as needed for headache or migraine.   . Estradiol (DIVIGEL) 1 MG/GM GEL Place 1 application onto the skin every morning.  . NONFORMULARY OR COMPOUNDED ITEM Apply 1 application topically at bedtime. Progesterone  . traMADol (ULTRAM) 50 MG tablet Take 50 mg by mouth 2 (two) times daily.                        Objective:   Physical Exam  07/23/2014        160  > 03/10/2016   161 > 08/26/2018  Declined to be weighed    10/08/13 160 lb (72.576 kg)  09/09/13 162 lb 12.8 oz (73.846 kg)  04/14/13 161 lb (73.029 kg)       amb wf nad  Vital signs reviewed - Note on arrival 02 sats  96% on RA    HEENT: nl dentition, turbinates bilaterally, and oropharynx. Nl external ear canals without cough reflex   NECK :  without JVD/Nodes/TM/ nl carotid upstrokes bilaterally   LUNGS: no acc muscle use,  Nl contour chest which is clear to A and P bilaterally without cough on insp or exp maneuvers   CV:  RRR  no s3 or murmur or increase in P2, and no edema   ABD:  soft and nontender with nl inspiratory excursion in the supine position. No bruits or organomegaly appreciated, bowel sounds nl  MS:  Nl gait/ ext warm without deformities, calf tenderness, cyanosis or clubbing No obvious joint restrictions   SKIN: warm and dry without lesions    NEURO:  alert, approp, nl sensorium with  no motor or cerebellar deficits apparent.              Assessment & Plan:

## 2018-08-27 ENCOUNTER — Encounter: Payer: Self-pay | Admitting: Internal Medicine

## 2018-08-27 NOTE — Assessment & Plan Note (Signed)
-   See CT chest 2006 and 09/02/13 1. No acute cardiopulmonary abnormalities.  2. Spectrum of findings within the lungs, including peripheral  tree-in-bud nodularity and bronchiectasis, favor sequelae of chronic  indolent/atypical infection. 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  3. Small pericardial effusion - CT abd 02/29/16 Lower chest: In the inferior aspect of the lingula and anterior left lower lobe, there are multiple small indeterminate pulmonary nodules. Largest has mean diameter of 6 mm on image 7 of series 5. These are new since 2010 exam -CT chest  repeat 01/12/16 Multiple bilateral pulmonary nodules. When compared to chest CT 09/02/2013, there is a new nodule within the right middle lobe, left upper lobe and superior segment right lower lobe. The largest within the right lower lobe has a mean dimension of 9 mm. Non-contrast chest CT at 3-6 months is recommended> rec 6  m study in reminder file > did not do  - repeat CT ordered 08/26/2018    Advised: This is an extremely common benign condition in the elderly and does not warrant aggressive eval/ rx at this point unless there is a clinical correlation suggesting unaddressed pulmonary infection (purulent sputum, night sweats, unintended wt loss, doe) or significant change on CT vs previous studies and if this one is the stable vs priors I would not continue to chase the nodules with multiple studies but rather do plain cxr's when/if resp  symtoms flare   Discussed in detail all the  indications, usual  risks and alternatives  relative to the benefits with patient who agrees to proceed with w/u as outlined.

## 2018-08-27 NOTE — Assessment & Plan Note (Signed)
-   recurrence off gerd rx 07/23/2014 > rec resume rx> resolved - recurrence off gerd rx 03/10/2016 > rec resume rx > resolved  - recurrence off gerd rx 08/26/2018 > rec resume Rx    Of the three most common causes of  Sub-acute / recurrent or chronic cough, only one (GERD)  can actually contribute to/ trigger  the other two (asthma and post nasal drip syndrome)  and perpetuate the cylce of cough.  While not intuitively obvious, many patients with chronic low grade reflux do not cough until there is a primary insult that disturbs the protective epithelial barrier and exposes sensitive nerve endings.   This is typically viral but can due to PNDS and  either may apply here.   The point is that once this occurs, it is difficult to eliminate the cycle  using anything but a maximally effective acid suppression regimen at least in the short run, accompanied by an appropriate diet to address non acid GERD and control / eliminate the cough itself with otc delsym and if not better then to return here    I had an extended discussion with the patient reviewing all relevant studies completed to date and  lasting 15 to 20 minutes of a 25 minute acute office  visit    Each maintenance medication was reviewed in detail including most importantly the difference between maintenance and prns and under what circumstances the prns are to be triggered using an action plan format that is not reflected in the computer generated alphabetically organized AVS.     Please see AVS for specific instructions unique to this visit that I personally wrote and verbalized to the the pt in detail and then reviewed with pt  by my nurse highlighting any  changes in therapy recommended at today's visit to their plan of care.

## 2018-09-03 ENCOUNTER — Telehealth (INDEPENDENT_AMBULATORY_CARE_PROVIDER_SITE_OTHER): Payer: Self-pay | Admitting: Orthopedic Surgery

## 2018-09-03 NOTE — Telephone Encounter (Signed)
IC s/w patient she said that you had previously done a hip bursa surgery on her left hip couple years ago-she Wanted to know if you were a "hip specialist"?? She states recently has been getting increased pain in the left hip/side/leg. She does have some radicular leg pain down to her knee. She is unable to lay on left side without taking medication. Lateral aspect of hip is tender to touch, denies groin pain. She said that she has tried walking for exercise 30-45 minutes several times a week but the pain is severe.  She wants to know what you recommend. Please advise. Thanks.

## 2018-09-03 NOTE — Telephone Encounter (Signed)
Patient would like to speak with you before she makes an appointment to see Dr. Marlou Sa.  [phone].  Thank you.

## 2018-09-04 NOTE — Telephone Encounter (Signed)
I am not with you would call a hip specialist.  But I have had good success with the procedure on other patients that she had.  I do not think there is really anything to do at this point but if she wants to see someone who sees a lot of this type of problem I would consider setting herself up with may be Dr. Aretha Parrot at [facility] thanks

## 2018-09-04 NOTE — Telephone Encounter (Signed)
Tried calling patient. No answer. LMVM advising per Dr Marlou Sa.

## 2018-09-09 ENCOUNTER — Ambulatory Visit (INDEPENDENT_AMBULATORY_CARE_PROVIDER_SITE_OTHER)
Admission: RE | Admit: 2018-09-09 | Discharge: 2018-09-09 | Disposition: A | Discharge status: Home / Self Care | Payer: Medicare Other | Source: Ambulatory Visit | Attending: Internal Medicine | Admitting: Internal Medicine

## 2018-09-09 DIAGNOSIS — R059 Cough, unspecified: Secondary | ICD-10-CM

## 2018-09-09 DIAGNOSIS — R918 Other nonspecific abnormal finding of lung field: Secondary | ICD-10-CM

## 2018-09-09 DIAGNOSIS — R05 Cough: Secondary | ICD-10-CM | POA: Diagnosis not present

## 2018-09-09 NOTE — Progress Notes (Signed)
Spoke with pt and notified of results per Dr. Wert. Pt verbalized understanding and denied any questions. 

## 2018-09-23 ENCOUNTER — Telehealth (INDEPENDENT_AMBULATORY_CARE_PROVIDER_SITE_OTHER): Payer: Self-pay | Admitting: Orthopedic Surgery

## 2018-09-23 NOTE — Telephone Encounter (Signed)
Patient needs to pick up of xray CD by 9:00am tomorrow morning to take to appt for 2nd OP appt.

## 2018-09-24 NOTE — Telephone Encounter (Signed)
Placed in box at front desk for pick up.

## 2018-09-26 ENCOUNTER — Other Ambulatory Visit: Payer: Self-pay | Admitting: Orthopedic Surgery

## 2018-09-26 DIAGNOSIS — S73191A Other sprain of right hip, initial encounter: Secondary | ICD-10-CM

## 2018-10-07 ENCOUNTER — Ambulatory Visit
Admission: RE | Admit: 2018-10-07 | Discharge: 2018-10-07 | Disposition: A | Discharge status: Home / Self Care | Payer: Medicare Other | Source: Ambulatory Visit | Attending: Orthopedic Surgery | Admitting: Orthopedic Surgery

## 2018-10-07 DIAGNOSIS — S73191A Other sprain of right hip, initial encounter: Secondary | ICD-10-CM

## 2018-10-07 MED ORDER — IOPAMIDOL (ISOVUE-M 200) INJECTION 41%
15.0000 mL | Freq: Once | INTRAMUSCULAR | Status: AC
  Administered 2018-10-07: 15 mL via INTRA_ARTICULAR

## 2018-10-11 ENCOUNTER — Ambulatory Visit: Payer: Medicare Other | Admitting: Internal Medicine

## 2018-10-23 ENCOUNTER — Ambulatory Visit: Payer: Medicare Other | Admitting: Internal Medicine

## 2018-10-28 ENCOUNTER — Other Ambulatory Visit (HOSPITAL_COMMUNITY): Payer: Self-pay | Admitting: Internal Medicine

## 2018-10-28 DIAGNOSIS — M79604 Pain in right leg: Secondary | ICD-10-CM

## 2018-10-29 ENCOUNTER — Ambulatory Visit (HOSPITAL_COMMUNITY)
Admission: RE | Admit: 2018-10-29 | Discharge: 2018-10-29 | Disposition: A | Discharge status: Home / Self Care | Payer: Medicare Other | Source: Ambulatory Visit | Attending: Internal Medicine | Admitting: Internal Medicine

## 2018-10-29 DIAGNOSIS — M79604 Pain in right leg: Secondary | ICD-10-CM | POA: Insufficient documentation

## 2018-11-13 ENCOUNTER — Encounter: Payer: Self-pay | Admitting: Internal Medicine

## 2018-11-13 ENCOUNTER — Ambulatory Visit (INDEPENDENT_AMBULATORY_CARE_PROVIDER_SITE_OTHER): Payer: Medicare Other | Admitting: Internal Medicine

## 2018-11-13 VITALS — BP 126/74 | HR 71 | Ht 70.0 in | Wt 162.0 lb

## 2018-11-13 DIAGNOSIS — R05 Cough: Secondary | ICD-10-CM

## 2018-11-13 DIAGNOSIS — R059 Cough, unspecified: Secondary | ICD-10-CM

## 2018-11-13 DIAGNOSIS — R918 Other nonspecific abnormal finding of lung field: Secondary | ICD-10-CM

## 2018-11-13 MED ORDER — ACETAMINOPHEN-CODEINE #3 300-30 MG PO TABS
1.0000 | ORAL_TABLET | ORAL | 0 refills | Status: AC | PRN
Start: 2018-11-13 — End: 2018-11-18

## 2018-11-13 NOTE — Patient Instructions (Addendum)
Bronchiectasis =   you have scarring of your bronchial tubes which means that they don't function perfectly normally and mucus tends to pool in certain areas of your lung which can cause pneumonia and further scarring of your lung and bronchial tubes  Whenever you develop cough congestion take mucinex or mucinex dm > these will help keep the mucus loose and flowing but if your condition worsens you need to seek help immediately preferably here or somewhere inside the [facility] system to compare xrays ( worse = darker or bloody mucus or pain on breathing in)     For cough congestion > mucinex dm 1200 mg every 12 hours and Pantoprazole (protonix) 40 mg   Take  30-60 min before first meal of the day and Pepcid (famotidine)  20 mg one @  bedtime until all better for a week for a week without the need for cough suppression   If need to suppress the cough to sleep prefer you avoid antihistamines and use Tylenol # 3 one half to one at bedtime    Please schedule a follow up visit in 3 months but call sooner if needed -  PFTs on return - needs alpa one and Ig profile on return

## 2018-11-13 NOTE — Progress Notes (Signed)
Subjective:   Patient ID: [name], female    DOB: 12-08-1951   MRN: [id number]    Brief patient profile:  89  yowf quit smoking 1979 s sequelae with sinus infections dating back to the 90's with fall> spring pnds s/p sinus surgery in 90's then sick p trip to [place] mid August 3 days after arrival with sinus pain bad cough rx by Ernesto Rutherford with nasty mucus (green) with abx cleared the mucus but worse cough dx as bronchitis > cxr > no acute changes > ct abn so referred by Dr Jackson Latino to pulmonary clinic 09/09/13 with dx of bronchiectasis 09/02/13 assoc with mpns    History of Present Illness  09/09/2013 1st [facility] office visit/ [name]  Chief Complaint  Patient presents with  . Pulmonary Consult    Referred per Dr [name] for eval of abnormal ct chest. Pt c/o dyspnea and pain between shoulder blades x 6 wks.    cp only centered in back not pleuritic but rather positional  Feels heaviness in chest worse first thing in am  Cough is better but still present,  Mostly dry hack and urge to clear throat day > night rec Pantoprazole (protonix) 40 mg   Take 30-60 min before first meal of the day and Pepcid 20 mg one bedtime until return to office - this is the best way to tell whether stomach acid is contributing to your problem.   GERD diet   10/08/2013 f/u ov/[name] re: cough/ sob ? UACS Chief Complaint  Patient presents with  . Followup with PFT    Back pain is unchanged since last visit. Cough has improved some. Her dyspnea is slightly improved.   although definitely better,  Does not feel any benefit from acid suppression or diet isues, thinks it's all weather/ season related. No more chest heaviness in ams rec Bronchiectasis =   you have scarring of your bronchial tubes  Ok to try off the acid suppression after 4 weeks of therapy to see what difference if any this makes and start back immediately if cough flares for any reason.   07/23/2014 f/u ov/[name] re: recurrent cough, did not  activate action plan  Chief Complaint  Patient presents with  . Acute Visit    Pt c/o trouble taking a deep breath in the morning and intermittnly throughout the day. C/o increase in work of breathing, dry cough and chest heaviness and mid upper back x 1 week.    onset 2 weeks abrupt sensation of palpitations "with skip beat" Then 1 week prior to OV  Sensation of not being able to take a deep breath rx with alb due to findings on pfts but not really much better and caused palp worse so not suing  No change ex tol/ no sob walking, no trouble sitting, sleeping   Problems with speaking with hoarseness, sob  Pain is midline has same pattern and characteristics as prev episodes  rec Pantoprazole (protonix) 40 mg   Take 30-60 min before first meal of the day and Pepcid 20 mg one bedtime until return to office   GERD diet  Best inhaler should you need one to use as needed is  xopenex 1-2 puffs every 4 hours and does not cause heart racing   03/10/2016  f/u ov/[name] re: mpns/ uacs Chief Complaint  Patient presents with  . Follow-up    Pt c/o hoarseness recently. She has to clear her throat often. She had recent ct abd/pelvis which noted her  MPN and she would like to discuss if there have been any changes.   cough had cleared on gerd rx but ran out and did not try otcs/ daytime urge to clear throat, worse with voice use s sob  rec GERD diet   No change rx     08/26/2018  Acute/ extended ov/[name] re: cough was resolved  off reflux meds until one month prior to OV ,  Started using some cbd pen  Chief Complaint  Patient presents with  . Acute Visit    Cough and hoarseness over the past few wks. She is coughing up some clear sputum.   Dyspnea:  Not limited by breathing from desired activities   Cough: daytime > noct/ no am flare/ min clear mucus/ pnds / worse off gerd rx  Sleeping: fine one pillow flat rec  Pantoprazole (protonix) 40 mg   Take  30-60 min before first meal of the day and Pepcid  (famotidine)  20 mg one @  bedtime until cough is gone for at least a week with the need for cough medications  For cough > delsym 2 tsp every 12 hours as needed  GERD diet  Please see patient coordinator before you leave today  to schedule ct chest and sinus  >>> CT chest 09/09/18 c/w bronchiectasis worse since 01/11/17     . 11/13/2018  f/u ov/[name] re: bronchiectasis  Chief Complaint  Patient presents with  . Follow-up    getting over bronchitis- still coughing up some light yellow sputum.    Dyspnea:  Not limited by breathing from desired activities   Cough: acutely worse around 1st nov 2019 rx by pcp   With head cold / chest cold then  > [name] cefuroxime/ mucinex dm> much better  Still coughing sporadically daytime and occ does wake up/ better p nyquil at hs  Sleeping: bed is flat/ one pillow  SABA use: none 02: none     No obvious day to day or daytime variability or assoc mucus plugs or hemoptysis or cp or chest tightness, subjective wheeze or overt   hb symptoms.   Sleeping as above  without nocturnal  or early am exacerbation  of respiratory  c/o's or need for noct saba. Also denies any obvious fluctuation of symptoms with weather or environmental changes or other aggravating or alleviating factors except as outlined above   No unusual exposure hx or h/o childhood pna/ asthma or knowledge of premature birth.  Current Allergies, Complete Past Medical History, Past Surgical History, Family History, and Social History were reviewed in [employer] record.  ROS  The following are not active complaints unless bolded Hoarseness, sore throat, dysphagia, dental problems, itching, sneezing,  nasal congestion or discharge of excess mucus or purulent secretions, ear ache,   fever, chills, sweats, unintended wt loss or wt gain, classically pleuritic or exertional cp,  orthopnea pnd or arm/hand swelling  or leg swelling, presyncope, palpitations, abdominal pain,  anorexia, nausea, vomiting, diarrhea  or change in bowel habits or change in bladder habits, change in stools or change in urine, dysuria, hematuria,  rash, arthralgias, visual complaints, headache, numbness, weakness or ataxia or problems with walking or coordination,  change in mood or  memory.        Current Meds  Medication Sig  . ALPRAZolam (XANAX) 0.5 MG tablet Take 0.5 mg by mouth at bedtime.   . Ascorbic Acid (VITAMIN C) 1000 MG tablet Take 1,000 mg by mouth daily.  Marland Kitchen  atenolol (TENORMIN) 25 MG tablet TK 1 T PO QD.  . butalbital-acetaminophen-caffeine (FIORICET, ESGIC) 50-325-40 MG per tablet Take 1 tablet by mouth every 4 (four) hours as needed for headache or migraine.   . carboxymethylcellulose (REFRESH PLUS) 0.5 % SOLN 1 drop 3 (three) times daily as needed.  Marland Kitchen dextromethorphan-guaiFENesin (MUCINEX DM) 30-600 MG 12hr tablet Take 1 tablet by mouth 2 (two) times daily as needed for cough.  . Estradiol (DIVIGEL) 1 MG/GM GEL Place 1 application onto the skin every morning.  . Lactobacillus (FLORANEX PO) Take 1 tablet by mouth daily.  . NONFORMULARY OR COMPOUNDED ITEM Apply 1 application topically at bedtime. Progesterone  . Pseudoeph-Doxylamine-DM-APAP ([employer] NIGHT TIME COLD/FLU PO) Take by mouth as directed.  . therapeutic multivitamin-minerals (THERAGRAN-M) tablet Take 1 tablet by mouth daily.  . traMADol (ULTRAM) 50 MG tablet Take 50 mg by mouth 2 (two) times daily.  Marland Kitchen UNABLE TO FIND Med Name: Omega 3          Objective:   Physical Exam  07/23/2014        160  > 03/10/2016   161 >  11/13/2018  162     10/08/13 160 lb (72.576 kg)  09/09/13 162 lb 12.8 oz (73.846 kg)  04/14/13 161 lb (73.029 kg)     amb wf nad a bit iffy on details of care    Vital signs reviewed - Note on arrival 02 sats  97% on RA   HEENT: nl dentition, turbinates bilaterally, and oropharynx. Nl external ear canals without cough reflex   NECK :  without JVD/Nodes/TM/ nl carotid upstrokes  bilaterally   LUNGS: no acc muscle use,  Nl contour chest with minimal insp/exp rhonchi  bilaterally without cough on insp or exp maneuvers   CV:  RRR  no s3 or murmur or increase in P2, and no edema   ABD:  soft and nontender with nl inspiratory excursion in the supine position. No bruits or organomegaly appreciated, bowel sounds nl  MS:  Nl gait/ ext warm without deformities, calf tenderness, cyanosis   - No  clubbing No obvious joint restrictions   SKIN: warm and dry without lesions    NEURO:  alert, approp, nl sensorium with  no motor or cerebellar deficits apparent.             Assessment & Plan:

## 2018-11-14 ENCOUNTER — Encounter: Payer: Self-pay | Admitting: Internal Medicine

## 2018-11-14 NOTE — Assessment & Plan Note (Addendum)
-   recurrence off gerd rx 07/23/2014 > rec resume rx> resolved - recurrence off gerd rx 03/10/2016 > rec resume rx > resolved  - recurrence off gerd rx 08/26/2018 > rec resume Rx  - Sinus CT 09/09/2018 1. Mild frontal sinus mucosal thickening primarily on the right. 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 2. The remaining paranasal sinuses are well pneumatized. Previous right maxillary antrostomy  rec try avoiding antihistamines at hs and using mucinex dm  with tyl #3 if needed instead of risking worse sinus dz by using anticholinergic products with may slow mc fx in nasal passages which appear at least partly blocked > refer back [facility] for f/u

## 2018-11-14 NOTE — Assessment & Plan Note (Signed)
-   See CT chest 2006 and 09/02/13 1. No acute cardiopulmonary abnormalities.  2. Spectrum of findings within the lungs, including peripheral  tree-in-bud nodularity and bronchiectasis, favor sequelae of chronic  indolent/atypical infection. 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  3. Small pericardial effusion - CT abd 02/29/16 Lower chest: In the inferior aspect of the lingula and anterior left lower lobe, there are multiple small indeterminate pulmonary nodules. Largest has mean diameter of 6 mm on image 7 of series 5. These are new since 2010 exam -CT chest  repeat 01/12/16 Multiple bilateral pulmonary nodules. When compared to chest CT 09/02/2013, there is a new nodule within the right middle lobe, left upper lobe and superior segment right lower lobe. The largest within the right lower lobe has a mean dimension of 9 mm. Non-contrast chest CT at 3-6 months is recommended> rec 6  m study in reminder file > did not do  - repeat CT  09/09/2018 >>> 1. 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 Findings overall mildly worsened since 01/11/2017 chest CT. 2. Stable chronic small pericardial effusion/thickening. 3. One vessel coronary atherosclerosis    Reviewed again the pathophysiology of bronchiectasis using a simple escalator in dept store analogy emphasizing the impt of sinusitis and GERD as possible contributing to her problem so   1) f/u with sinus dz [name] 2) mucinex dm prn cough /congestion 3) gerd rx if coughing at all or requiring any cough meds 4) return for pfts and alpha one/ Ig's     I had an extended discussion with the patient reviewing all relevant studies completed to date and  lasting 15 to  20 minutes of a 25 minute visit    Each maintenance medication was reviewed in detail including most importantly the difference between maintenance and prns and under what circumstances the prns are to be triggered using an action plan format that is not reflected in the computer generated alphabetically organized AVS.     Please see AVS for specific instructions unique to this visit that I personally wrote and verbalized to the the pt in detail and then reviewed with pt  by my nurse highlighting any  changes in therapy recommended at today's visit to their plan of care.

## 2019-02-28 ENCOUNTER — Ambulatory Visit (INDEPENDENT_AMBULATORY_CARE_PROVIDER_SITE_OTHER): Payer: Medicare Other | Admitting: Orthopedic Surgery

## 2019-03-12 ENCOUNTER — Ambulatory Visit: Payer: Medicare Other | Admitting: Internal Medicine

## 2019-04-25 ENCOUNTER — Other Ambulatory Visit: Payer: Self-pay | Admitting: Internal Medicine

## 2019-04-25 NOTE — Telephone Encounter (Signed)
Is this appropriate for refill ? 

## 2019-05-19 ENCOUNTER — Telehealth: Payer: Self-pay | Admitting: Internal Medicine

## 2019-05-19 NOTE — Telephone Encounter (Signed)
Will hold in triage and call pt after 3pm per request.

## 2019-05-19 NOTE — Telephone Encounter (Signed)
Called and spoke with pt who stated she has had increased SOB x1 month. Pt also states that she feels like she has mucus in her throat that she is unable to get up.  Pt has taken mucinex to help with her breathing. Pt has also been using her albuterol inhaler and states she has had to use it about 3 times daily. Pt also has had heaviness in her chest.  Pt denies any complaints of fever, body aches or chills.  Only recent travel that pt has done was to [facility].  Stated to pt that we should get her scheduled for televisit and she verbalized understanding. Pt has been scheduled for televisit with Derl Barrow, tomorrow 6/9 at 11am. Nothing further needed.

## 2019-05-20 ENCOUNTER — Ambulatory Visit (INDEPENDENT_AMBULATORY_CARE_PROVIDER_SITE_OTHER): Payer: Medicare Other | Admitting: Primary Care

## 2019-05-20 ENCOUNTER — Other Ambulatory Visit: Payer: Self-pay | Admitting: Primary Care

## 2019-05-20 ENCOUNTER — Other Ambulatory Visit: Payer: Self-pay

## 2019-05-20 ENCOUNTER — Encounter: Payer: Self-pay | Admitting: Primary Care

## 2019-05-20 DIAGNOSIS — R05 Cough: Secondary | ICD-10-CM

## 2019-05-20 DIAGNOSIS — R059 Cough, unspecified: Secondary | ICD-10-CM

## 2019-05-20 DIAGNOSIS — R0602 Shortness of breath: Secondary | ICD-10-CM | POA: Diagnosis not present

## 2019-05-20 MED ORDER — FAMOTIDINE 20 MG PO TABS: 20.0000 mg | ORAL_TABLET | Freq: Every day | ORAL | 1 refills | Status: DC

## 2019-05-20 MED ORDER — PANTOPRAZOLE SODIUM 40 MG PO TBEC: 40.0000 mg | DELAYED_RELEASE_TABLET | Freq: Every day | ORAL | 1 refills | Status: DC

## 2019-05-20 MED ORDER — FLUTICASONE PROPIONATE HFA 44 MCG/ACT IN AERO: 2.0000 | INHALATION_SPRAY | Freq: Two times a day (BID) | RESPIRATORY_TRACT | 2 refills | Status: DC

## 2019-05-20 NOTE — Progress Notes (Signed)
Virtual Visit via Telephone Note  I connected with Dickson on 05/20/19 at 11:00 AM EDT by telephone and verified that I am speaking with the correct person using two identifiers.  Location: Patient: Home Provider: Office   I discussed the limitations, risks, security and privacy concerns of performing an evaluation and management service by telephone and the availability of in person appointments. I also discussed with the patient that there may be a patient responsible charge related to this service. The patient expressed understanding and agreed to proceed.  History of Present Illness: 68 year old female, former smoker. Past medical history significant for chronic cough, bronchiectasis, pulmonary nodules. Patient of Dr. Melvyn Novas, last seen December 2019.  Recommended 63-month follow-up with repeat PFTs and alpha-1 testing (hold   05/20/2019 Patient called office today with reports of increased shortness of breath x1 month since end April/may.  Associated cough, rarely productive with clear mucus. Denies significant chest congestion. States that she runs out of breath while talking. Some improvement after SABA. Report occasional chest heaviness and heart palpations in the afternoon. Saw Dr. Quillian Quince with cardiology yesterday, awaiting results from Holter monitor. Feels symptoms could be stress related. Denies fever, sick contact, flu like symptoms.    Observations/Objective:  - No significant sob, wheezing or cough noted during phone conversation  Assessment and Plan:  Bronchiectasis - Reports sob and rare prod cough - Continue mucinex twice daily - Trial Flovent hfa 41mcg - two puffs twice daily  - Needs PFTs and alpha 1/ resp allergy panel - Continue Albuterol hfa every 4-6 hours as needed for sob/wheezing  GERD - Chronic cough, worsen of GERD treatment  - Resume protonix 40mg  daily  - Resume famotidine 20mg  at bedtime  Palpitations - Following with Dr. Olena Heckle from [facility]iology, last seen on 05/19/19 - Awaiting Holter monitor results   Follow Up Instructions:  1 month with Dr. Melvyn Novas  I discussed the assessment and treatment plan with the patient. The patient was provided an opportunity to ask questions and all were answered. The patient agreed with the plan and demonstrated an understanding of the instructions.   The patient was advised to call back or seek an in-person evaluation if the symptoms worsen or if the condition fails to improve as anticipated.  I provided 22 minutes of non-face-to-face time during this encounter.   Martyn Ehrich, NP

## 2019-05-20 NOTE — Patient Instructions (Addendum)
Needs to re-schedule follow-up with Dr. Melvyn Novas in 1 month  PFTs and lab work before   Recommendations: Resume protonix daily and famotidine at bedtime Continue Mucinex twice daily Start Flovent inhaler twice daily Continue Albuterol rescue inhaler every 4-6 hours as needed for breakthrough shortness of breath/wheezing  RX: Trial Flovent inhaler, take 2 puffs twice daily

## 2019-05-21 NOTE — Progress Notes (Signed)
Chart and office note reviewed in detail  > agree with a/p as outlined    

## 2019-06-03 ENCOUNTER — Telehealth: Payer: Self-pay | Admitting: Internal Medicine

## 2019-06-03 ENCOUNTER — Telehealth: Payer: Self-pay | Admitting: Student

## 2019-06-03 NOTE — Telephone Encounter (Signed)
Left message for patient to call back  

## 2019-06-03 NOTE — Telephone Encounter (Signed)
Nothing new to suggest if not doing better add to end of schedule with all meds in hand

## 2019-06-03 NOTE — Telephone Encounter (Signed)
Spoke with patient. She stated that she was seen by [facility] on 05/20/19. She followed Beth's recommendations for a few days but did not see any improvement. She followed up with her PCP and received an in office breathing treatment as well as a prednisone taper. She is slowly starting to feel better. She is still having issues with SOB. These episodes tend to come and go. Denied any increased coughing or fever, just the SOB. At times it becomes so bad that it feels like she is smoldering.   She wants MW to review her OV with Beth and see if he would recommend anything else to help with her SOB.   MW, please advise. Thanks!

## 2019-06-04 NOTE — Telephone Encounter (Signed)
Patient is returning phone call.  Patient phone number is [phone].

## 2019-06-04 NOTE — Telephone Encounter (Signed)
Called and spoke with Patient.  Dr Melvyn Novas recommendations given.  Patient scheduled to see Dr Melvyn Novas 06/09/19, at 1:30pm. Patient instructed to bring meds to OV.  Nothing further at this time.

## 2019-06-05 NOTE — Telephone Encounter (Signed)
Error

## 2019-06-09 ENCOUNTER — Ambulatory Visit (INDEPENDENT_AMBULATORY_CARE_PROVIDER_SITE_OTHER): Payer: Medicare Other

## 2019-06-09 ENCOUNTER — Other Ambulatory Visit: Payer: Self-pay

## 2019-06-09 ENCOUNTER — Ambulatory Visit (INDEPENDENT_AMBULATORY_CARE_PROVIDER_SITE_OTHER): Payer: Medicare Other | Admitting: Internal Medicine

## 2019-06-09 ENCOUNTER — Encounter: Payer: Self-pay | Admitting: Internal Medicine

## 2019-06-09 DIAGNOSIS — R0602 Shortness of breath: Secondary | ICD-10-CM

## 2019-06-09 DIAGNOSIS — R0609 Other forms of dyspnea: Secondary | ICD-10-CM

## 2019-06-09 DIAGNOSIS — R059 Cough, unspecified: Secondary | ICD-10-CM

## 2019-06-09 DIAGNOSIS — R918 Other nonspecific abnormal finding of lung field: Secondary | ICD-10-CM

## 2019-06-09 DIAGNOSIS — R05 Cough: Secondary | ICD-10-CM

## 2019-06-09 LAB — CBC WITH DIFFERENTIAL/PLATELET
Basophils Absolute: 0.1 10*3/uL (ref 0.0–0.1)
Basophils Relative: 0.8 % (ref 0.0–3.0)
Eosinophils Absolute: 0.2 10*3/uL (ref 0.0–0.7)
Eosinophils Relative: 2 % (ref 0.0–5.0)
HCT: 38.4 % (ref 36.0–46.0)
Hemoglobin: 13 g/dL (ref 12.0–15.0)
Lymphocytes Relative: 20.1 % (ref 12.0–46.0)
Lymphs Abs: 1.5 10*3/uL (ref 0.7–4.0)
MCHC: 33.7 g/dL (ref 30.0–36.0)
MCV: 97.1 fl (ref 78.0–100.0)
Monocytes Absolute: 0.7 10*3/uL (ref 0.1–1.0)
Monocytes Relative: 9.5 % (ref 3.0–12.0)
Neutro Abs: 5.1 10*3/uL (ref 1.4–7.7)
Neutrophils Relative %: 67.6 % (ref 43.0–77.0)
Platelets: 199 10*3/uL (ref 150.0–400.0)
RBC: 3.95 Mil/uL (ref 3.87–5.11)
RDW: 13.7 % (ref 11.5–15.5)
WBC: 7.5 10*3/uL (ref 4.0–10.5)

## 2019-06-09 LAB — BRAIN NATRIURETIC PEPTIDE: Pro B Natriuretic peptide (BNP): 49 pg/mL (ref 0.0–100.0)

## 2019-06-09 LAB — TSH: TSH: 2.49 u[IU]/mL (ref 0.35–4.50)

## 2019-06-09 MED ORDER — BUDESONIDE-FORMOTEROL FUMARATE 80-4.5 MCG/ACT IN AERO: 2.0000 | INHALATION_SPRAY | Freq: Two times a day (BID) | RESPIRATORY_TRACT | 0 refills | Status: DC

## 2019-06-09 MED ORDER — BUDESONIDE-FORMOTEROL FUMARATE 80-4.5 MCG/ACT IN AERO: INHALATION_SPRAY | RESPIRATORY_TRACT | 12 refills | Status: DC

## 2019-06-09 NOTE — Progress Notes (Signed)
Spoke with pt and notified of results per Dr. Wert. Pt verbalized understanding and denied any questions. 

## 2019-06-09 NOTE — Patient Instructions (Addendum)
Stop flovent  Start symbicort 80 Take 2 puffs first thing in am and then another 2 puffs about 12 hours later.  - if no better on symbicort then stop all inhalers   Only use your albuterol as a rescue medication to be used if you can't catch your breath by resting or doing a relaxed purse lip breathing pattern.  - The less you use it, the better it will work when you need it. - Ok to use up to 2 puffs  every 4 hours if you must but call for immediate appointment if use goes up over your usual need - Don't leave home without it !!  (think of it like the spare tire for your car)    Please remember to go to the lab and x-ray department   for your tests - we will call you with the results when they are available.  Please schedule a follow up office visit in 6 weeks, call sooner if needed

## 2019-06-09 NOTE — Progress Notes (Signed)
Subjective:   Patient ID: [name], female    DOB: 12-08-1951   MRN: [id number]    Brief patient profile:  89  yowf quit smoking 1979 s sequelae with sinus infections dating back to the 90's with fall> spring pnds s/p sinus surgery in 90's then sick p trip to [place] mid August 3 days after arrival with sinus pain bad cough rx by Ernesto Rutherford with nasty mucus (green) with abx cleared the mucus but worse cough dx as bronchitis > cxr > no acute changes > ct abn so referred by Dr Jackson Latino to pulmonary clinic 09/09/13 with dx of bronchiectasis 09/02/13 assoc with mpns    History of Present Illness  09/09/2013 1st [facility] office visit/   Chief Complaint  Patient presents with  . Pulmonary Consult    Referred per Dr [name] for eval of abnormal ct chest. Pt c/o dyspnea and pain between shoulder blades x 6 wks.    cp only centered in back not pleuritic but rather positional  Feels heaviness in chest worse first thing in am  Cough is better but still present,  Mostly dry hack and urge to clear throat day > night rec Pantoprazole (protonix) 40 mg   Take 30-60 min before first meal of the day and Pepcid 20 mg one bedtime until return to office - this is the best way to tell whether stomach acid is contributing to your problem.   GERD diet   10/08/2013 f/u ov/ re: cough/ sob ? UACS Chief Complaint  Patient presents with  . Followup with PFT    Back pain is unchanged since last visit. Cough has improved some. Her dyspnea is slightly improved.   although definitely better,  Does not feel any benefit from acid suppression or diet isues, thinks it's all weather/ season related. No more chest heaviness in ams rec Bronchiectasis =   you have scarring of your bronchial tubes  Ok to try off the acid suppression after 4 weeks of therapy to see what difference if any this makes and start back immediately if cough flares for any reason.   07/23/2014 f/u ov/ re: recurrent cough, did not  activate action plan  Chief Complaint  Patient presents with  . Acute Visit    Pt c/o trouble taking a deep breath in the morning and intermittnly throughout the day. C/o increase in work of breathing, dry cough and chest heaviness and mid upper back x 1 week.    onset 2 weeks abrupt sensation of palpitations "with skip beat" Then 1 week prior to OV  Sensation of not being able to take a deep breath rx with alb due to findings on pfts but not really much better and caused palp worse so not suing  No change ex tol/ no sob walking, no trouble sitting, sleeping   Problems with speaking with hoarseness, sob  Pain is midline has same pattern and characteristics as prev episodes  rec Pantoprazole (protonix) 40 mg   Take 30-60 min before first meal of the day and Pepcid 20 mg one bedtime until return to office   GERD diet  Best inhaler should you need one to use as needed is  xopenex 1-2 puffs every 4 hours and does not cause heart racing   03/10/2016  f/u ov/ re: mpns/ uacs Chief Complaint  Patient presents with  . Follow-up    Pt c/o hoarseness recently. She has to clear her throat often. She had recent ct abd/pelvis which noted her  MPN and she would like to discuss if there have been any changes.   cough had cleared on gerd rx but ran out and did not try otcs/ daytime urge to clear throat, worse with voice use s sob  rec GERD diet   No change rx     08/26/2018  Acute/ extended ov/ re: cough was resolved  off reflux meds until one month prior to OV ,  Started using some cbd pen  Chief Complaint  Patient presents with  . Acute Visit    Cough and hoarseness over the past few wks. She is coughing up some clear sputum.   Dyspnea:  Not limited by breathing from desired activities   Cough: daytime > noct/ no am flare/ min clear mucus/ pnds / worse off gerd rx  Sleeping: fine one pillow flat rec  Pantoprazole (protonix) 40 mg   Take  30-60 min before first meal of the day and Pepcid  (famotidine)  20 mg one @  bedtime until cough is gone for at least a week with the need for cough medications  For cough > delsym 2 tsp every 12 hours as needed  GERD diet  Please see patient coordinator before you leave today  to schedule ct chest and sinus  >>> CT chest 09/09/18 c/w bronchiectasis worse since 01/11/17     . 11/13/2018  f/u ov/ re: bronchiectasis  Chief Complaint  Patient presents with  . Follow-up    getting over bronchitis- still coughing up some light yellow sputum.    Dyspnea:  Not limited by breathing from desired activities   Cough: acutely worse around 1st nov 2019 rx by pcp   With head cold / chest cold then  > [name] cefuroxime/ mucinex dm> much better  Still coughing sporadically daytime and occ does wake up/ better p nyquil at hs  Sleeping: bed is flat/ one pillow rec Bronchiectasis =   you have scarring of your bronchial tubes which means that they don't function perfectly normally and mucus tends to pool in certain areas of your lung which can cause pneumonia and further scarring of your lung and bronchial tubes Whenever you develop cough congestion take mucinex or mucinex dm  For cough congestion > mucinex dm 1200 mg every 12 hours and Pantoprazole (protonix) 40 mg   Take  30-60 min before first meal of the day and Pepcid (famotidine)  20 mg one @  bedtime until all better for a week for a week without the need for cough suppression  If need to suppress the cough to sleep prefer you avoid antihistamines and use Tylenol # 3 one half to one at bedtime  Please schedule a follow up visit in 3 months but call sooner if needed -  PFTs on return - needs alpa one and Ig profile on return       Televisit 05/20/2019  Recommendations: Resume protonix daily and famotidine at bedtime Continue Mucinex twice daily Start Flovent inhaler twice daily Continue Albuterol rescue inhaler every 4-6 hours as needed for breakthrough shortness of breath/wheezing  RX: Trial  Flovent inhaler, take 2 puffs twice daily      06/09/2019  f/u ov/ re: sob in pt bronchiectasis  Chief Complaint  Patient presents with  . Acute Visit    Pt c/o SOB and hoarseness x 2 months.   Dyspnea:  [place] beach "not at my nl pace" feels like gradually losing ground with ex tol / admits becoming much more  sedentary since covid 19  Cough: none/some throat congestion but no mucus  Sleeping: flat/ one pillow SABA use: bid ? seems to help ?    No obvious day to day or daytime variability or assoc excess/ purulent sputum or mucus plugs or hemoptysis or cp or chest tightness, subjective wheeze or overt sinus or hb symptoms.   Sleeping as above  without nocturnal  or early am exacerbation  of respiratory  c/o's or need for noct saba. Also denies any obvious fluctuation of symptoms with weather or environmental changes or other aggravating or alleviating factors except as outlined above   No unusual exposure hx or h/o childhood pna/ asthma or knowledge of premature birth.  Current Allergies, Complete Past Medical History, Past Surgical History, Family History, and Social History were reviewed in [employer] record.  ROS  The following are not active complaints unless bolded Hoarseness, sore throat, dysphagia, dental problems, itching, sneezing,  nasal congestion or discharge of excess mucus or purulent secretions, ear ache,   fever, chills, sweats, unintended wt loss or wt gain, classically pleuritic or exertional cp,  orthopnea pnd or arm/hand swelling  or leg swelling, presyncope, palpitations, abdominal pain, anorexia, nausea, vomiting, diarrhea  or change in bowel habits or change in bladder habits, change in stools or change in urine, dysuria, hematuria,  rash, arthralgias, visual complaints, headache, numbness, weakness or ataxia or problems with walking or coordination,  change in mood or  memory.        Current Meds  Medication Sig  . ALPRAZolam  (XANAX) 0.5 MG tablet Take 0.5 mg by mouth 3 (three) times daily as needed.   . Ascorbic Acid (VITAMIN C) 1000 MG tablet Take 1,000 mg by mouth daily.  Marland Kitchen atenolol (TENORMIN) 25 MG tablet TK 1 T PO QD.  . butalbital-acetaminophen-caffeine (FIORICET, ESGIC) 50-325-40 MG per tablet Take 1 tablet by mouth every 4 (four) hours as needed for headache or migraine.   . carboxymethylcellulose (REFRESH PLUS) 0.5 % SOLN 1 drop 3 (three) times daily as needed.  Marland Kitchen dextromethorphan-guaiFENesin (MUCINEX DM) 30-600 MG 12hr tablet Take 1 tablet by mouth 2 (two) times daily as needed for cough.  . Estradiol (DIVIGEL) 1 MG/GM GEL Place 1 application onto the skin every morning.  . fluticasone (FLOVENT HFA) 44 MCG/ACT inhaler Inhale 2 puffs into the lungs 2 (two) times daily.  . Lactobacillus (FLORANEX PO) Take 1 tablet by mouth daily.  . NONFORMULARY OR COMPOUNDED ITEM Apply 1 application topically at bedtime. Progesterone  . pantoprazole (PROTONIX) 40 MG tablet TAKE 1 TABLET(40 MG) BY MOUTH DAILY  . predniSONE (DELTASONE) 10 MG tablet TK 2 TS D X3 DAYS THEN 1 T D X4 DAYS  . Pseudoeph-Doxylamine-DM-APAP ([employer] NIGHT TIME COLD/FLU PO) Take by mouth as directed.  . therapeutic multivitamin-minerals (THERAGRAN-M) tablet Take 1 tablet by mouth daily.  . traMADol (ULTRAM) 50 MG tablet TAKE 1 TABLET BY MOUTH EVERY 6 HOURS AS NEEDED FOR SEVERE PAIN( MAX OF 20 TABS PER MONTH)  . UNABLE TO FIND Med Name: Omega 3                          Objective:   Physical Exam  07/23/2014        160  > 03/10/2016   161 >  11/13/2018  162     10/08/13 160 lb (72.576 kg)  09/09/13 162 lb 12.8 oz (73.846 kg)  04/14/13 161 lb (73.029 kg)  amb wf nad    Vital signs reviewed - Note on arrival 02 sats  97% on RA      HEENT: nl dentition, turbinates bilaterally, and oropharynx. Nl external ear canals without cough reflex   NECK :  without JVD/Nodes/TM/ nl carotid upstrokes bilaterally   LUNGS: no acc muscle use,  Nl  contour chest which is clear to A and P bilaterally without cough on insp or exp maneuvers   CV:  RRR  no s3 or murmur or increase in P2, and no edema   ABD:  soft and nontender with nl inspiratory excursion in the supine position. No bruits or organomegaly appreciated, bowel sounds nl  MS:  Nl gait/ ext warm without deformities, calf tenderness, cyanosis or clubbing No obvious joint restrictions   SKIN: warm and dry without lesions    NEURO:  alert, approp, nl sensorium with  no motor or cerebellar deficits apparent.      CXR PA and Lateral:   06/09/2019 :    I personally reviewed images and agree with radiology impression as follows:   1. No acute findings. 2. 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  Labs ordered/ reviewed:      Chemistry      Component Value Date/Time   NA 135 06/09/2019 1417   K 4.3 06/09/2019 1417   CL 98 06/09/2019 1417   CO2 27 06/09/2019 1417   BUN 13 06/09/2019 1417   CREATININE 0.68 06/09/2019 1417      Component Value Date/Time   CALCIUM 9.0 06/09/2019 1417        Lab Results  Component Value Date   WBC 7.5 06/09/2019   HGB 13.0 06/09/2019   HCT 38.4 06/09/2019   MCV 97.1 06/09/2019   PLT 199.0 06/09/2019       EOS                                                                0.2                                   06/09/2019   Lab Results  Component Value Date   DDIMER 0.48 06/09/2019      Lab Results  Component Value Date   TSH 2.49 06/09/2019     Lab Results  Component Value Date   PROBNP 49.0 06/09/2019              Assessment & Plan:

## 2019-06-10 ENCOUNTER — Encounter: Payer: Self-pay | Admitting: Internal Medicine

## 2019-06-10 LAB — BASIC METABOLIC PANEL
BUN: 13 mg/dL (ref 6–23)
CO2: 27 mEq/L (ref 19–32)
Calcium: 9 mg/dL (ref 8.4–10.5)
Chloride: 98 mEq/L (ref 96–112)
Creatinine, Ser: 0.68 mg/dL (ref 0.40–1.20)
GFR: 86.63 mL/min (ref >60.00)
Glucose, Bld: 108 mg/dL — ABNORMAL HIGH (ref 70–99)
Potassium: 4.3 mEq/L (ref 3.5–5.1)
Sodium: 135 mEq/L (ref 135–145)

## 2019-06-10 NOTE — Assessment & Plan Note (Addendum)
-   recurrence off gerd rx 07/23/2014 > rec resume rx> resolved - recurrence off gerd rx 03/10/2016 > rec resume rx > resolved  - recurrence off gerd rx 08/26/2018 > rec resume Rx  - Sinus CT 09/09/2018 1. Mild frontal sinus mucosal thickening primarily on the right. 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 2. The remaining paranasal sinuses are well pneumatized. Previous right maxillary antrostomy 05/20/2019 - recurrence off gerd rx > rec resuming rx> improved as of 06/09/2019  Allergy profile 06/09/2019 >  Eos 0. /  IgE    Adequate control on present rx, reviewed in detail with pt > no change in rx needed

## 2019-06-10 NOTE — Assessment & Plan Note (Addendum)
Symptoms are markedly disproportionate to objective findings and not clear to what extent this is actually a pulmonary  problem but pt does appear to have difficult to sort out respiratory symptoms of unknown origin for which  DDX  = almost all start with A and  include Adherence, Ace Inhibitors, Acid Reflux, Active Sinus Disease, Alpha 1 Antitripsin deficiency, Anxiety masquerading as Airways dz,  ABPA,  Allergy(esp in young), Aspiration (esp in elderly), Adverse effects of meds,  Active smoking or Vaping, A bunch of PE's/clot burden (a few small clots can't cause this syndrome unless there is already severe underlying pulm or vascular dz with poor reserve),  Anemia or thyroid disorder, plus two Bs  = Bronchiectasis and Beta blocker use..and one C= CHF     Adherence is always the initial "prime suspect" and is a multilayered concern that requires a "trust but verify" approach in every patient - starting with knowing how to use medications, especially inhalers, correctly, keeping up with refills and understanding the fundamental difference between maintenance and prns vs those medications only taken for a very short course and then stopped and not refilled.  - see hfa teaching  - return with all meds in hand using a trust but verify approach to confirm accurate Medication  Reconciliation The principal here is that until we are certain that the  patients are doing what we've asked, it makes no sense to ask them to do more.    ? Allergy/asthma > start symb 80 2bid/ check allergy profile  ? Anxiety > usually at the bottom of this list of usual suspects but should be much higher on this pt's based on H and P and note already on psychotropics and may interfere with adherence and also interpretation of response or lack thereof to symptom management which can be quite subjective.   ? Acid (or non-acid) GERD > always difficult to exclude as up to 75% of pts in some series report no assoc GI/ Heartburn symptoms>  rec continue max (24h)  acid suppression and diet restrictions/ reviewed     ? Anemia/ thyroid dz > ruled out today  ? A bunch of PE's  D dimer nl - while a normal  or high normal value (seen commonly in the elderly or chronically ill)  may miss small peripheral pe, the clot burden with sob is moderately high and the d dimer  has a very high neg pred value if used in this setting.    ? Beta blocker effects > unlikely as long as tenormin dose stays so low   ? CHF > bnp low with active symptoms excludes for all intents and purposes     I had an extended discussion with the patient reviewing all relevant studies completed to date and  lasting 25 minutes of a 40  minute acute office visit re new non-specific but potentially very serious refractory respiratory symptoms of uncertain and potentially multiple  Etiologies.  I directly observed portions of ambulatory 02 saturation study/   teaching both of which extended face to face time for this visit    Each maintenance medication was reviewed in detail including most importantly the difference between maintenance and prns and under what circumstances the prns are to be triggered using an action plan format that is not reflected in the computer generated alphabetically organized AVS.    Please see AVS for specific instructions unique to this office visit that I personally wrote and verbalized to the the pt in detail  and then reviewed with pt  by my nurse highlighting any changes in therapy/plan of care  recommended at today's visit.

## 2019-06-10 NOTE — Assessment & Plan Note (Addendum)
DX by  CT chest 2006 and 09/02/13 1. No acute cardiopulmonary abnormalities.  2. Spectrum of findings within the lungs, including peripheral  tree-in-bud nodularity and bronchiectasis, favor sequelae of chronic  indolent/atypical infection. 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  3. Small pericardial effusion - CT abd 02/29/16 Lower chest: In the inferior aspect of the lingula and anterior left lower lobe, there are multiple small indeterminate pulmonary nodules. Largest has mean diameter of 6 mm on image 7 of series 5. These are new since 2010 exam -CT chest  repeat 01/12/16 Multiple bilateral pulmonary nodules. When compared to chest CT 09/02/2013, there is a new nodule within the right middle lobe, left upper lobe and superior segment right lower lobe. The largest within the right lower lobe has a mean dimension of 9 mm. Non-contrast chest CT at 3-6 months is recommended> rec 6  m study in reminder file > did not do  - repeat CT  09/09/2018 >>> 1. 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 Findings overall mildly worsened since 01/11/2017 chest CT. 2. Stable chronic small pericardial effusion/thickening. 3. One vessel coronary atherosclerosis   06/09/2019  After extensive coaching inhaler device,  effectiveness =    90% > start symb 80 2bid 06/09/2019  Alpha one AT screen pending  06/09/2019  Quant Ig pending   No evidence of a significant flare but may have element of obstructive bronchiectasis responding by her account to saba so reasonable to try symb 80 (low doses of ICS more approp in this setting than the higher option)  X 2 weeks then reassess in 6 weeks

## 2019-06-18 LAB — RESPIRATORY ALLERGY PROFILE REGION II ~~LOC~~

## 2019-06-18 LAB — ALPHA-1 ANTITRYPSIN PHENOTYPE: A-1 Antitrypsin, Ser: 127 mg/dL (ref 83–199)

## 2019-06-18 LAB — D-DIMER, QUANTITATIVE: D-Dimer, Quant: 0.48 mcg/mL FEU (ref <0.50)

## 2019-06-18 LAB — IGG, IGA, IGM
IgG (Immunoglobin G), Serum: 1003 mg/dL (ref 600–1540)
IgM, Serum: 94 mg/dL (ref 50–300)
Immunoglobulin A: 225 mg/dL (ref 70–320)

## 2019-06-18 LAB — INTERPRETATION:

## 2019-06-24 ENCOUNTER — Ambulatory Visit: Payer: Medicare Other | Admitting: Internal Medicine

## 2019-06-26 IMAGING — CT CT PARANASAL SINUSES LIMITED
1 of 2 series · 12 of 15 positions shown, 15 images · non-contrast
Comparison: Cervical spine MRI 03/09/2011.

CLINICAL DATA: 67-year-old female with persistent cough.

EXAM:
CT PARANASAL SINUS LIMITED WITHOUT CONTRAST
TECHNIQUE: Non-contiguous multidetector CT images of the paranasal sinuses were
obtained in a single plane without contrast.

[Series 4: limited sinus st · axial · 0.32mm/px · z∈[+1242,+1352]mm · 12 of 14 slices shown, 15 images]
[im 2/14  brain]
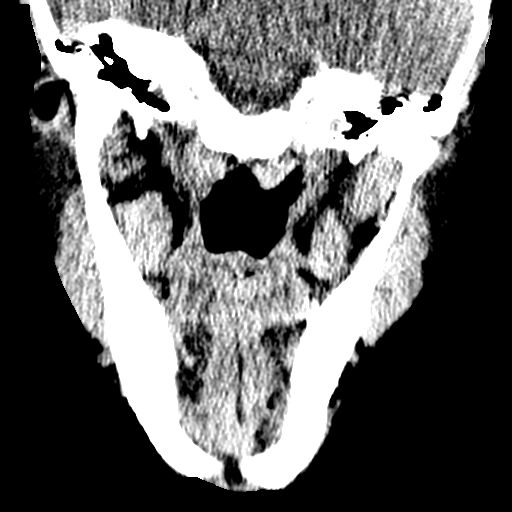
[im 2/14  bone]
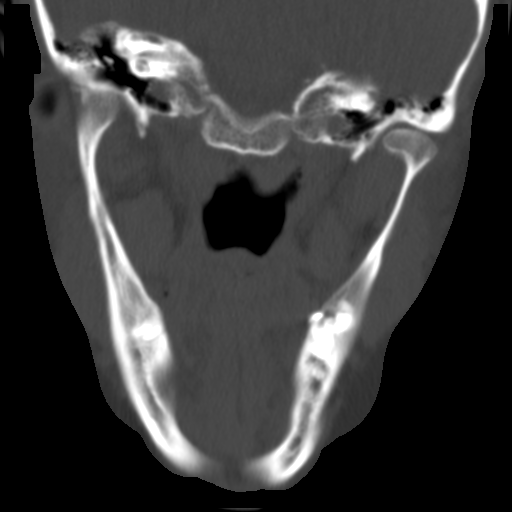
[im 3/14  bone]
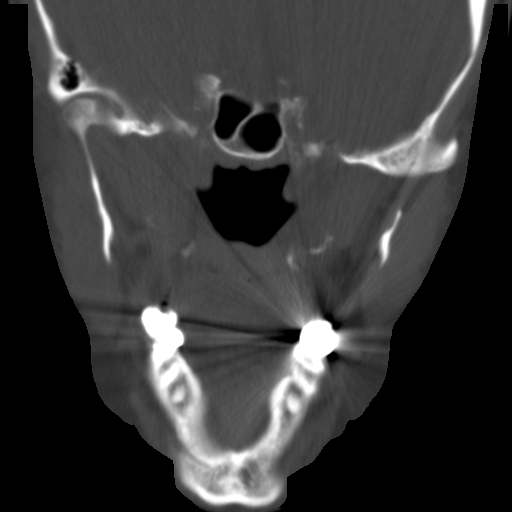
[im 4/14  bone]
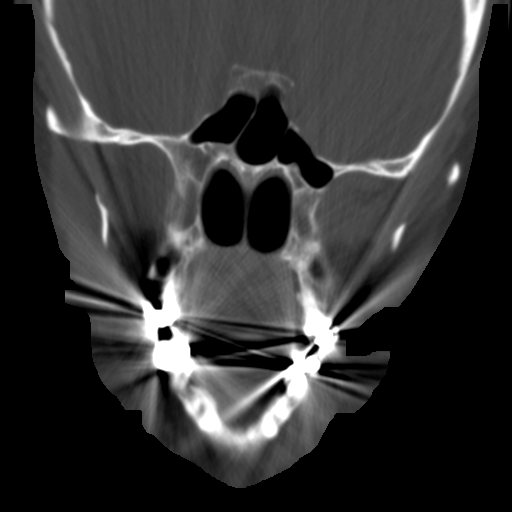
[im 5/14  bone]
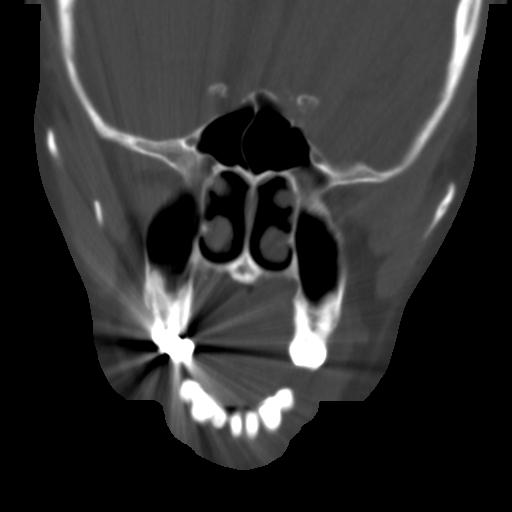
[im 6/14  brain]
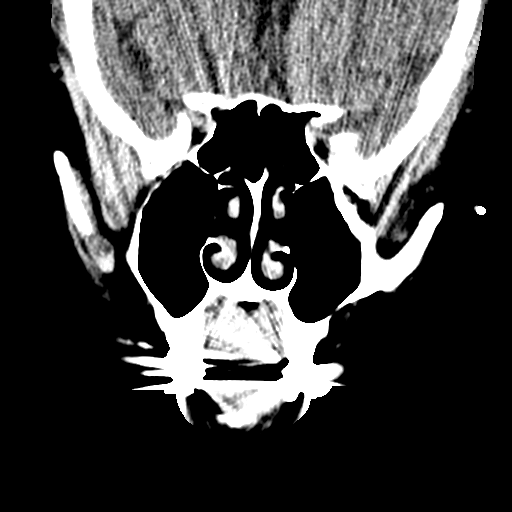
[im 6/14  bone]
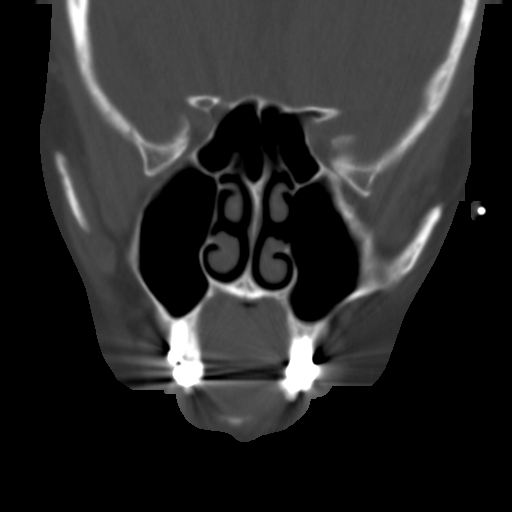
[im 7/14  bone]
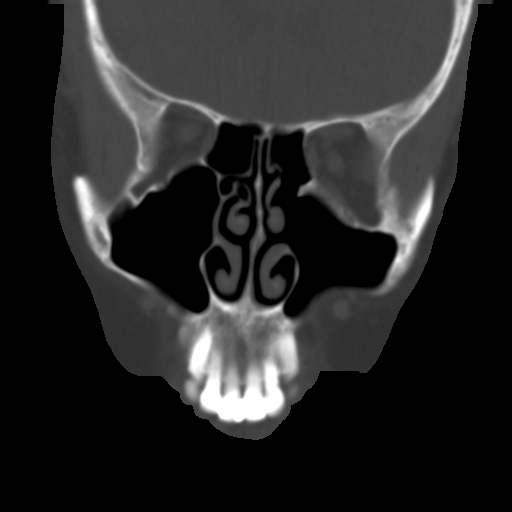
[im 8/14  bone]
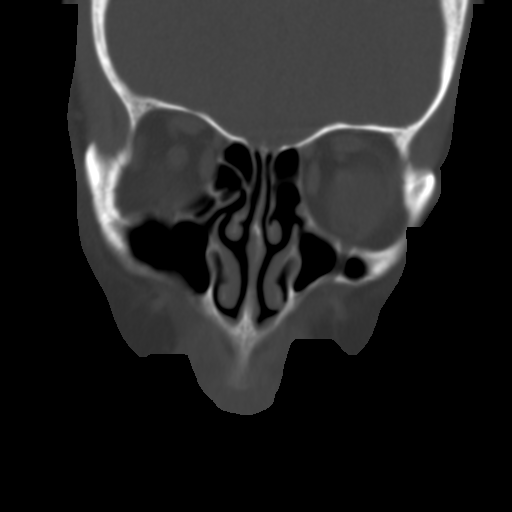
[im 9/14  bone]
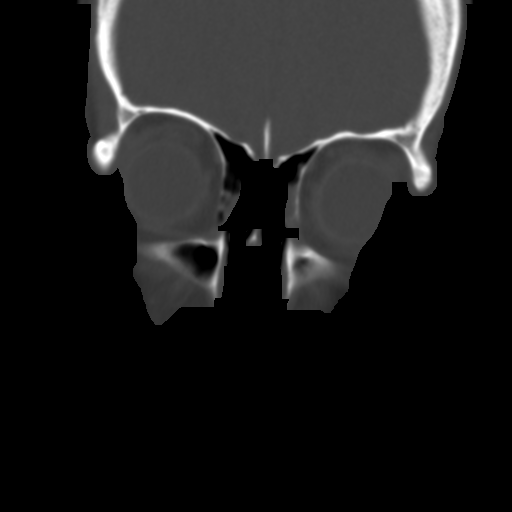
[im 10/14  brain]
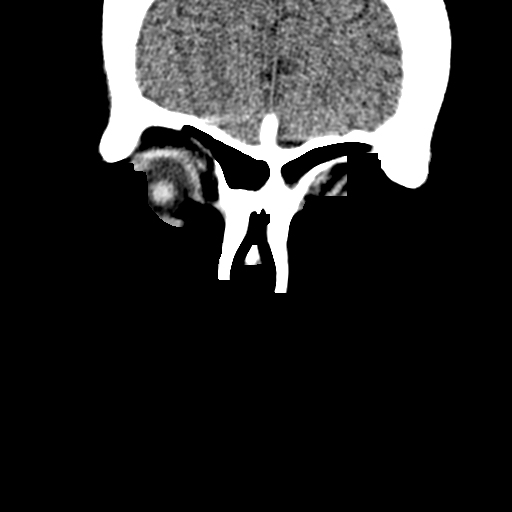
[im 10/14  bone]
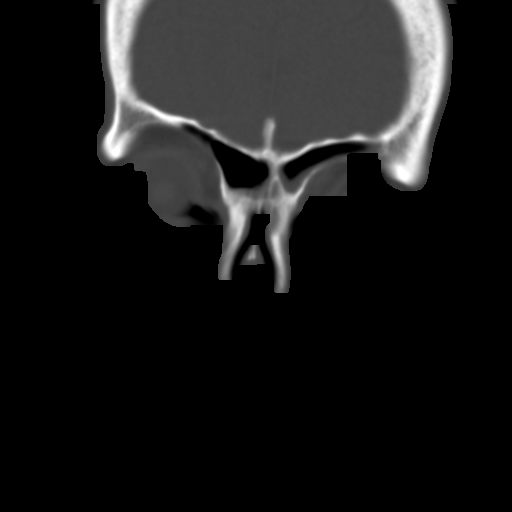
[im 11/14  bone]
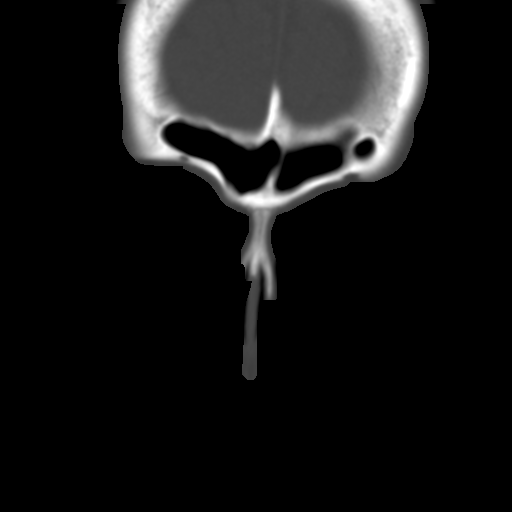
[im 12/14  bone]
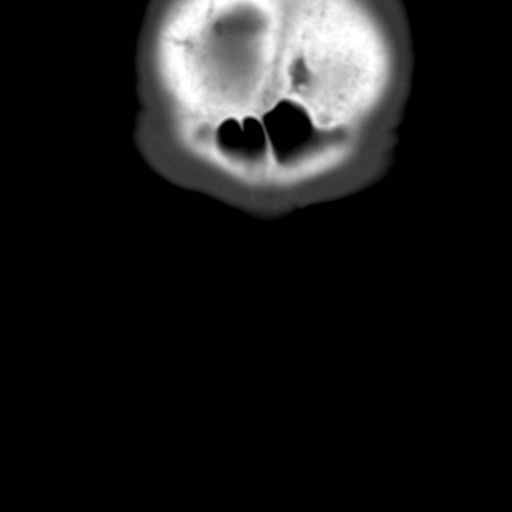
[im 13/14  bone]
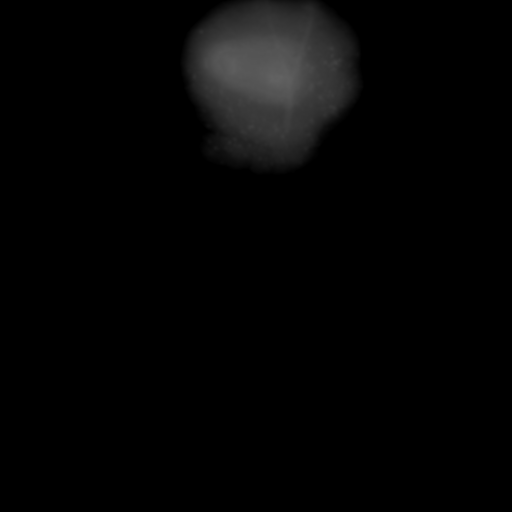

[12 of 15 positions shown; findings below may reference images not displayed]

FINDINGS: Negative visible noncontrast brain parenchyma. Grossly negative
visible orbit and face soft tissues.

The visible tympanic cavities and mastoids are clear.

There is mild mucosal thickening in the frontal sinuses, more so on
the right (series 3, image 5). The remaining paranasal sinuses are
clear. Previous right maxillary antrostomy with hyperplastic
bilateral maxillary and right sphenoid sinuses. Negative nasal
cavity.

No acute osseous abnormality identified.
IMPRESSION: 1. Mild frontal sinus mucosal thickening primarily on the right.
Consider right frontoethmoidal pattern obstructive disease.
2. The remaining paranasal sinuses are well pneumatized. Previous
right maxillary antrostomy.

## 2019-07-14 ENCOUNTER — Ambulatory Visit (INDEPENDENT_AMBULATORY_CARE_PROVIDER_SITE_OTHER): Payer: Medicare Other | Admitting: Orthopedic Surgery

## 2019-07-14 ENCOUNTER — Other Ambulatory Visit: Payer: Self-pay

## 2019-07-14 ENCOUNTER — Ambulatory Visit
Admission: RE | Admit: 2019-07-14 | Discharge: 2019-07-14 | Disposition: A | Discharge status: Home / Self Care | Payer: Medicare Other | Source: Ambulatory Visit | Attending: Orthopedic Surgery | Admitting: Orthopedic Surgery

## 2019-07-14 ENCOUNTER — Encounter: Payer: Self-pay | Admitting: Orthopedic Surgery

## 2019-07-14 DIAGNOSIS — R0781 Pleurodynia: Secondary | ICD-10-CM | POA: Diagnosis not present

## 2019-07-14 MED ORDER — HYDROCODONE-ACETAMINOPHEN 5-325 MG PO TABS: ORAL_TABLET | ORAL | 0 refills | Status: DC
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
## 2019-07-15 ENCOUNTER — Telehealth: Payer: Self-pay | Admitting: Orthopedic Surgery

## 2019-07-15 NOTE — Telephone Encounter (Signed)
Pt called in requesting her results from her ct scan done yesterday. Please give her a call [phone]. Pt said dr.dean told her she can get her results through the phone.

## 2019-07-15 NOTE — Telephone Encounter (Signed)
Please advise. Thanks.  
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
## 2019-07-28 ENCOUNTER — Ambulatory Visit: Payer: Medicare Other | Admitting: Internal Medicine

## 2019-08-04 ENCOUNTER — Ambulatory Visit (INDEPENDENT_AMBULATORY_CARE_PROVIDER_SITE_OTHER): Payer: Medicare Other | Admitting: Orthopedic Surgery

## 2019-08-04 ENCOUNTER — Ambulatory Visit: Payer: Self-pay

## 2019-08-04 DIAGNOSIS — M25561 Pain in right knee: Secondary | ICD-10-CM | POA: Diagnosis not present

## 2019-08-05 ENCOUNTER — Encounter: Payer: Self-pay | Admitting: Orthopedic Surgery
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
## 2019-08-07 ENCOUNTER — Encounter: Payer: Self-pay | Admitting: Orthopedic Surgery

## 2019-08-07 MED ORDER — METHYLPREDNISOLONE ACETATE 40 MG/ML IJ SUSP
40.0000 mg | INTRAMUSCULAR | Status: AC | PRN
  Administered 2019-08-05: 23:00:00 40 mg via INTRA_ARTICULAR

## 2019-08-07 MED ORDER — BUPIVACAINE HCL 0.25 % IJ SOLN
4.0000 mL | INTRAMUSCULAR | Status: AC | PRN
  Administered 2019-08-05: 23:00:00 4 mL via INTRA_ARTICULAR

## 2019-08-07 MED ORDER — LIDOCAINE HCL 1 % IJ SOLN
5.0000 mL | INTRAMUSCULAR | Status: AC | PRN
  Administered 2019-08-05: 23:00:00 5 mL

## 2019-08-27 ENCOUNTER — Other Ambulatory Visit: Payer: Self-pay

## 2019-08-27 ENCOUNTER — Telehealth: Payer: Self-pay | Admitting: Orthopedic Surgery

## 2019-08-27 DIAGNOSIS — M25561 Pain in right knee: Secondary | ICD-10-CM

## 2019-08-27 NOTE — Telephone Encounter (Signed)
Please advise. [place] for scan?

## 2019-08-27 NOTE — Telephone Encounter (Signed)
Patient left a voicemail message requesting a call back in reference to her knee and getting an MRI scheduled ASAP.  [phone].  Thank you.
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
## 2019-08-27 NOTE — Telephone Encounter (Signed)
sure

## 2019-08-28 ENCOUNTER — Telehealth: Payer: Self-pay | Admitting: Orthopedic Surgery

## 2019-08-28 NOTE — Telephone Encounter (Signed)
Patient returned your call.  [phone].  Thank you.

## 2019-08-28 NOTE — Telephone Encounter (Signed)
I s/w patient and advised scan ordered

## 2019-09-04 ENCOUNTER — Other Ambulatory Visit: Payer: Self-pay

## 2019-09-04 ENCOUNTER — Ambulatory Visit
Admission: RE | Admit: 2019-09-04 | Discharge: 2019-09-04 | Disposition: A | Discharge status: Home / Self Care | Payer: Medicare Other | Source: Ambulatory Visit | Attending: Orthopedic Surgery | Admitting: Orthopedic Surgery

## 2019-09-04 DIAGNOSIS — M25561 Pain in right knee: Secondary | ICD-10-CM

## 2019-09-08 ENCOUNTER — Telehealth: Payer: Self-pay | Admitting: Orthopedic Surgery

## 2019-09-08 NOTE — Telephone Encounter (Signed)
Patient called. She would like her MRI results. Her call back number is [phone]
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
## 2019-09-08 NOTE — Telephone Encounter (Signed)
Please advise. Thanks.  

## 2019-09-09 ENCOUNTER — Ambulatory Visit: Payer: Medicare Other | Admitting: Orthopedic Surgery

## 2019-09-15 ENCOUNTER — Ambulatory Visit: Payer: Medicare Other | Admitting: Orthopedic Surgery

## 2019-10-09 ENCOUNTER — Telehealth: Payer: Self-pay | Admitting: Orthopedic Surgery

## 2019-10-09 NOTE — Telephone Encounter (Signed)
Pt called in wanting to know if she can get another injection in her knee due to physical therapy not helping the pain. Pt also is requesting a doctor's note excusing her from jury duty due to her condition at this time.    [phone]

## 2019-10-09 NOTE — Telephone Encounter (Signed)
Please advise. Thanks.  

## 2019-10-09 NOTE — Telephone Encounter (Signed)
Okay for note for jury duty and okay to inject if it has been least 6 weeks since the last shot.

## 2019-10-09 NOTE — Telephone Encounter (Signed)
IC patient. No answer. LMVM for her advising per Dr Marlou Sa in detail

## 2019-10-20 ENCOUNTER — Encounter: Payer: Self-pay | Admitting: Orthopedic Surgery

## 2019-10-20 ENCOUNTER — Ambulatory Visit (INDEPENDENT_AMBULATORY_CARE_PROVIDER_SITE_OTHER): Payer: Medicare Other | Admitting: Orthopedic Surgery

## 2019-10-20 ENCOUNTER — Other Ambulatory Visit: Payer: Self-pay

## 2019-10-20 DIAGNOSIS — M7050 Other bursitis of knee, unspecified knee: Secondary | ICD-10-CM

## 2019-10-22 ENCOUNTER — Encounter: Payer: Self-pay | Admitting: Orthopedic Surgery

## 2019-10-22 DIAGNOSIS — M7051 Other bursitis of knee, right knee: Secondary | ICD-10-CM | POA: Diagnosis not present

## 2019-10-22 MED ORDER — BUPIVACAINE HCL 0.5 % IJ SOLN
2.0000 mL | INTRAMUSCULAR | Status: AC | PRN
  Administered 2019-10-22: 07:00:00 2 mL via INTRA_ARTICULAR

## 2019-10-22 MED ORDER — LIDOCAINE HCL 1 % IJ SOLN
3.0000 mL | INTRAMUSCULAR | Status: AC | PRN
  Administered 2019-10-22: 3 mL

## 2019-10-22 MED ORDER — METHYLPREDNISOLONE ACETATE 40 MG/ML IJ SUSP
30.0000 mg | INTRAMUSCULAR | Status: AC | PRN
  Administered 2019-10-22: 30 mg via INTRA_ARTICULAR
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
## 2019-11-10 ENCOUNTER — Telehealth: Payer: Self-pay | Admitting: Orthopedic Surgery

## 2019-11-10 NOTE — Telephone Encounter (Signed)
Patient [name] requesting a call back @ [phone] from Dr. Marlou Sa or Lauren regarding the last knee injection she recieved.
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
## 2019-11-14 NOTE — Telephone Encounter (Signed)
Please advise. Thanks.  

## 2019-11-14 NOTE — Telephone Encounter (Signed)
I called [name].  Still is having some knee pain.  Would like to try gel injection in the knee.  Can you see if you can get it approved through insurance.  And then call her.  Thanks

## 2019-11-17 ENCOUNTER — Telehealth: Payer: Self-pay

## 2019-11-17 NOTE — Telephone Encounter (Signed)
Submitted VOB for SynviscOne, right knee. 

## 2019-11-17 NOTE — Telephone Encounter (Signed)
Noted.  Will submit.  

## 2019-11-20 ENCOUNTER — Telehealth: Payer: Self-pay | Admitting: Orthopedic Surgery

## 2019-11-20 NOTE — Telephone Encounter (Signed)
Can you please just make sure patient is aware once authorization complete. Thanks.

## 2019-11-20 NOTE — Telephone Encounter (Signed)
Wants to make sure her gel injection pre Josem Kaufmann is in process. Advised her it was submitted on 12/7. Stated no call back necessary but wanted to know if things were being processed.

## 2019-11-20 NOTE — Telephone Encounter (Signed)
Noted.  I will check. Thank you.

## 2019-11-21 ENCOUNTER — Telehealth: Payer: Self-pay

## 2019-11-21 NOTE — Telephone Encounter (Signed)
Patient aware that she is approved for gel injection.  Approved for SynviscOne, right knee. [place] Patient will be responsible for 20% OOP. Co-pay of $35.00 required No PA required  Appt. 12/01/2019 with Dr. Marlou Sa

## 2019-11-21 NOTE — Telephone Encounter (Signed)
Appt. Scheduled for 12/01/2019 with Dr. Marlou Sa

## 2019-12-01 ENCOUNTER — Ambulatory Visit (INDEPENDENT_AMBULATORY_CARE_PROVIDER_SITE_OTHER): Payer: Medicare Other | Admitting: Orthopedic Surgery

## 2019-12-01 ENCOUNTER — Other Ambulatory Visit: Payer: Self-pay

## 2019-12-01 DIAGNOSIS — M1711 Unilateral primary osteoarthritis, right knee: Secondary | ICD-10-CM

## 2019-12-02 ENCOUNTER — Encounter: Payer: Self-pay | Admitting: Orthopedic Surgery

## 2019-12-02 DIAGNOSIS — M1711 Unilateral primary osteoarthritis, right knee: Secondary | ICD-10-CM

## 2019-12-02 MED ORDER — HYLAN G-F 20 48 MG/6ML IX SOSY
48.0000 mg | PREFILLED_SYRINGE | INTRA_ARTICULAR | Status: AC | PRN
  Administered 2019-12-02: 48 mg via INTRA_ARTICULAR

## 2019-12-02 MED ORDER — LIDOCAINE HCL 1 % IJ SOLN
5.0000 mL | INTRAMUSCULAR | Status: AC | PRN
  Administered 2019-12-02: 21:00:00 5 mL

## 2019-12-02 NOTE — Progress Notes (Signed)
   Procedure Note  Patient: [name]             Date of Birth: 05-Dec-1951           MRN: [id number]             Visit Date: 12/01/2019  Procedures: Visit Diagnoses:  1. Arthritis of right knee     Large Joint Inj: R knee on 12/02/2019 9:21 PM Indications: pain, joint swelling and diagnostic evaluation Details: 18 G 1.5 in needle, superolateral approach  Arthrogram: No  Medications: 5 mL lidocaine 1 %; 48 mg Hylan 48 MG/6ML Outcome: tolerated well, no immediate complications Procedure, treatment alternatives, risks and benefits explained, specific risks discussed. Consent was given by the patient. Immediately prior to procedure a time out was called to verify the correct patient, procedure, equipment, support staff and site/side marked as required. Patient was prepped and draped in the usual sterile fashion.     20 cc aspirate also obtained from the right knee

## 2019-12-09 ENCOUNTER — Other Ambulatory Visit: Payer: Self-pay

## 2019-12-09 ENCOUNTER — Encounter: Payer: Self-pay | Admitting: Internal Medicine

## 2019-12-09 ENCOUNTER — Ambulatory Visit: Payer: Medicare Other | Admitting: Internal Medicine

## 2019-12-09 ENCOUNTER — Telehealth: Payer: Self-pay | Admitting: Internal Medicine

## 2019-12-09 DIAGNOSIS — R05 Cough: Secondary | ICD-10-CM | POA: Diagnosis not present

## 2019-12-09 DIAGNOSIS — R059 Cough, unspecified: Secondary | ICD-10-CM

## 2019-12-09 DIAGNOSIS — R918 Other nonspecific abnormal finding of lung field: Secondary | ICD-10-CM | POA: Diagnosis not present

## 2019-12-09 MED ORDER — MONTELUKAST SODIUM 10 MG PO TABS: 10.0000 mg | ORAL_TABLET | Freq: Every day | ORAL | 11 refills | Status: DC

## 2019-12-09 NOTE — Patient Instructions (Addendum)
Start singulair 10 mg one daily after supper x one month - do not refill if cough/drainage are not better  = call for allergy eval or refer to [facility]   In meantime zyrtec 10 mg in evening as needed for itchy sneezy nose as needed   Please schedule a follow up visit in 6  months but call sooner if needed with pfts before

## 2019-12-09 NOTE — Progress Notes (Signed)
Subjective:   Patient ID: [name], female    DOB: [birth year]   MRN: [id number]    Brief patient profile:  41  yowf  MM quit smoking 1979 s sequelae with sinus infections dating back to the 90's with fall> spring pnds s/p sinus surgery in 90's then sick p trip to [place] mid August 3 days after arrival with sinus pain bad cough rx by [name] with nasty mucus (green) with abx cleared the mucus but worse cough dx as bronchitis > cxr > no acute changes > ct abn so referred by Dr [name] to pulmonary clinic 09/09/13 with dx of bronchiectasis 09/02/13 assoc with mpns    History of Present Illness  09/09/2013 1st [facility] office visit/ [name]  Chief Complaint  Patient presents with  . Pulmonary Consult    Referred per Dr [name] for eval of abnormal ct chest. Pt c/o dyspnea and pain between shoulder blades x 6 wks.    cp only centered in back not pleuritic but rather positional  Feels heaviness in chest worse first thing in am  Cough is better but still present,  Mostly dry hack and urge to clear throat day > night rec Pantoprazole (protonix) 40 mg   Take 30-60 min before first meal of the day and Pepcid 20 mg one bedtime until return to office - this is the best way to tell whether stomach acid is contributing to your problem.   GERD diet   10/08/2013 f/u ov/[name] re: cough/ sob ? [facility] Chief Complaint  Patient presents with  . Followup with PFT    Back pain is unchanged since last visit. Cough has improved some. Her dyspnea is slightly improved.   although definitely better,  Does not feel any benefit from acid suppression or diet isues, thinks it's all weather/ season related. No more chest heaviness in ams rec Bronchiectasis =   you have scarring of your bronchial tubes  Ok to try off the acid suppression after 4 weeks of therapy to see what difference if any this makes and start back immediately if cough flares for any reason.   07/23/2014 f/u ov/[name] re: recurrent cough, did  not activate action plan  Chief Complaint  Patient presents with  . Acute Visit    Pt c/o trouble taking a deep breath in the morning and intermittnly throughout the day. C/o increase in work of breathing, dry cough and chest heaviness and mid upper back x 1 week.    onset 2 weeks abrupt sensation of palpitations "with skip beat" Then 1 week prior to OV  Sensation of not being able to take a deep breath rx with alb due to findings on pfts but not really much better and caused palp worse so not suing  No change ex tol/ no sob walking, no trouble sitting, sleeping   Problems with speaking with hoarseness, sob  Pain is midline has same pattern and characteristics as prev episodes  rec Pantoprazole (protonix) 40 mg   Take 30-60 min before first meal of the day and Pepcid 20 mg one bedtime until return to office   GERD diet  Best inhaler should you need one to use as needed is  xopenex 1-2 puffs every 4 hours and does not cause heart racing    08/26/2018  Acute/ extended ov/[name] re: cough was resolved  off reflux meds until one month prior to OV ,  Started using some cbd pen  Chief Complaint  Patient presents with  .  Acute Visit    Cough and hoarseness over the past few wks. She is coughing up some clear sputum.   Dyspnea:  Not limited by breathing from desired activities   Cough: daytime > noct/ no am flare/ min clear mucus/ pnds / worse off gerd rx  Sleeping: fine one pillow flat rec Pantoprazole (protonix) 40 mg   Take  30-60 min before first meal of the day and Pepcid (famotidine)  20 mg one @  bedtime until cough is gone for at least a week with the need for cough medications  For cough > delsym 2 tsp every 12 hours as needed  GERD diet  Please see patient coordinator before you leave today  to schedule ct chest and sinus  >>> CT chest 09/09/18 c/w bronchiectasis worse since 01/11/17     . 11/13/2018  f/u ov/[name] re: bronchiectasis  Chief Complaint  Patient presents with  . Follow-up     getting over bronchitis- still coughing up some light yellow sputum.    Dyspnea:  Not limited by breathing from desired activities   Cough: acutely worse around 1st nov 2019 rx by pcp   With head cold / chest cold then  > [name] cefuroxime/ mucinex dm> much better  Still coughing sporadically daytime and occ does wake up/ better p nyquil at hs  Sleeping: bed is flat/ one pillow rec Bronchiectasis =   you have scarring of your bronchial tubes which means that they don't function perfectly normally and mucus tends to pool in certain areas of your lung which can cause pneumonia and further scarring of your lung and bronchial tubes Whenever you develop cough congestion take mucinex or mucinex dm  For cough congestion > mucinex dm 1200 mg every 12 hours and Pantoprazole (protonix) 40 mg   Take  30-60 min before first meal of the day and Pepcid (famotidine)  20 mg one @  bedtime until all better for a week for a week without the need for cough suppression  If need to suppress the cough to sleep prefer you avoid antihistamines and use Tylenol # 3 one half to one at bedtime  Please schedule a follow up visit in 3 months but call sooner if needed -  PFTs on return       Televisit 05/20/2019  Recommendations: Resume protonix daily and famotidine at bedtime Continue Mucinex twice daily Start Flovent inhaler twice daily Continue Albuterol rescue inhaler every 4-6 hours as needed for breakthrough shortness of breath/wheezing  RX: Trial Flovent inhaler, take 2 puffs twice daily      06/09/2019  f/u ov/[name] re: sob in pt bronchiectasis  Chief Complaint  Patient presents with  . Acute Visit    Pt c/o SOB and hoarseness x 2 months.   Dyspnea:  [place] min on beach "not at my nl pace" feels like gradually losing ground with ex tol / admits becoming much more sedentary since covid 19  Cough: none/some throat congestion but no mucus  Sleeping: flat/ one pillow SABA use: bid ? seems to help  ? rec Stop flovent Start symbicort 80 Take 2 puffs first thing in am and then another 2 puffs about 12 hours later.  - if no better on symbicort then stop all inhalers Only use your albuterol as a rescue medication  Please remember to go to the lab and x-ray department   for your tests - we will call you with the results when they are available. Please schedule a follow  up office visit in 6 weeks, call sooner if needed    12/09/2019  f/u ov/Aydeen Blume re: bronchiectasis  Not maint on symb Chief Complaint  Patient presents with  . Acute Visit    Pt c/o cough and congestion "all year"   Dyspnea:  Limited by knee > breath off all inhalers  Cough: worse nasal/ chest congestion since Jan 2020/ mold under bedroom - prednisone really helped/ minimal mucoid sputum Sleeping: lies flat, wakes up s flare in am ok  SABA use: none 02: none    No obvious day to day or daytime variability or assoc   or mucus plugs or hemoptysis or cp or chest tightness, subjective wheeze or overt sinus or hb symptoms.   Sleeping  without nocturnal  or early am exacerbation  of respiratory  c/o's or need for noct saba. Also denies any obvious fluctuation of symptoms with weather or environmental changes or other aggravating or alleviating factors except as outlined above   No unusual exposure hx or h/o childhood pna/ asthma or knowledge of premature birth.  Current Allergies, Complete Past Medical History, Past Surgical History, Family History, and Social History were reviewed in [employer] record.  ROS  The following are not active complaints unless bolded Hoarseness, sore throat, dysphagia, dental problems, itching, sneezing,  nasal congestion or discharge of excess mucus or purulent secretions, ear ache,   fever, chills, sweats, unintended wt loss or wt gain, classically pleuritic or exertional cp,  orthopnea pnd or arm/hand swelling  or leg swelling, presyncope, palpitations, abdominal pain,  anorexia, nausea, vomiting, diarrhea  or change in bowel habits or change in bladder habits, change in stools or change in urine, dysuria, hematuria,  rash, arthralgias, visual complaints, headache, numbness, weakness or ataxia or problems with walking or coordination,  change in mood or  memory.        Current Meds  Medication Sig  . ALPRAZolam (XANAX) 0.5 MG tablet Take 0.5 mg by mouth 3 (three) times daily as needed.   . Ascorbic Acid (VITAMIN C) 1000 MG tablet Take 1,000 mg by mouth daily.  Marland Kitchen atenolol (TENORMIN) 25 MG tablet TK 1 T PO QD.  . butalbital-acetaminophen-caffeine (FIORICET, ESGIC) 50-325-40 MG per tablet Take 1 tablet by mouth every 4 (four) hours as needed for headache or migraine.   . Estradiol (DIVIGEL) 1 MG/GM GEL Place 1 application onto the skin every morning.  . NONFORMULARY OR COMPOUNDED ITEM Apply 1 application topically at bedtime. Progesterone  . pantoprazole (PROTONIX) 40 MG tablet TAKE 1 TABLET(40 MG) BY MOUTH DAILY  . Polyethyl Glycol-Propyl Glycol (SYSTANE FREE OP) Apply to eye.  . therapeutic multivitamin-minerals (THERAGRAN-M) tablet Take 1 tablet by mouth daily.  . traMADol (ULTRAM) 50 MG tablet TAKE 1 TABLET BY MOUTH EVERY 6 HOURS AS NEEDED FOR SEVERE PAIN( MAX OF 20 TABS PER MONTH)  . UNABLE TO FIND Med Name: Omega 3                              Objective:   Physical Exam  12/09/2019      161  07/23/2014        160  > 03/10/2016   161 >  11/13/2018  162     10/08/13 160 lb (72.576 kg)  09/09/13 162 lb 12.8 oz (73.846 kg)  04/14/13 161 lb (73.029 kg)     amb pleasant wf   Vital signs reviewed - Note on  arrival 02 sats  97% on RA              I personally reviewed images and agree with radiology impression as follows:   Chest CT 07/14/2019 Clustered tree-in-bud nodular densities in both lungs, most pronounced in the right lung. This likely reflects old or chronic/indolent infectious process or bronchiolitis. Largest nodule is in the  right upper lobe measuring 5 mm. Multiple calcified granulomas in the right lung. No follow-up needed if patient is low-risk (and has no known or suspected primary neoplasm).     Assessment & Plan:

## 2019-12-09 NOTE — Telephone Encounter (Signed)
PFT scheduled for 06/09/2019- will forward to [facility] for covid test to be scheduled thanks   Name: Gabrielle, Bender MRN: [id number]  Date: 06/08/2020 Status: Sch  Time: 11:00 AM Length: 60  Visit Type: PFT [1116] Copay: $35.00  Provider: LBPU-PFT RM Department: [facility] CARE  Referring Provider: Nolene Ebbs CSN: [id number]  Notes: full pft//lmr  Made On: 12/09/2019 11:50 AM

## 2019-12-09 NOTE — Telephone Encounter (Signed)
I checked the covid testing schedule for June and it hasn't been set up yet.  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  Nothing further needed at this time.  We will schedule her from pft schedule if she doesn't call us back.

## 2019-12-10 ENCOUNTER — Encounter: Payer: Self-pay | Admitting: Internal Medicine

## 2019-12-10 NOTE — Assessment & Plan Note (Signed)
DX by  CT chest 2006 and 09/02/13 1. No acute cardiopulmonary abnormalities.  2. Spectrum of findings within the lungs, including peripheral  tree-in-bud nodularity and bronchiectasis, favor sequelae of chronic  indolent/atypical infection. 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  3. Small pericardial effusion PFT 10/08/13 wnl  - CT abd 02/29/16 Lower chest: In the inferior aspect of the lingula and anterior left lower lobe, there are multiple small indeterminate pulmonary nodules. Largest has mean diameter of 6 mm on image 7 of series 5. These are new since 2010 exam -CT chest  repeat 01/12/16 Multiple bilateral pulmonary nodules. When compared to chest CT 09/02/2013, there is a new nodule within the right middle lobe, left upper lobe and superior segment right lower lobe. The largest within the right lower lobe has a mean dimension of 9 mm. Non-contrast chest CT at 3-6 months is recommended> rec 6  m study in reminder file > did not do  - repeat CT  09/09/2018 >>> 1. 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 Findings overall mildly worsened since 01/11/2017 chest CT. 2. Stable chronic small pericardial effusion/thickening. 3. One vessel coronary atherosclerosis  06/09/2019  After extensive coaching inhaler device,  effectiveness =    90% > start symb 80 2bid 06/09/2019  Alpha one AT  MM 06/09/2019  Quant Ig's  wnl  Doing ok off all inhalers with main issue related to pnds > see sep a/p  Pt informed of the seriousness of COVID 19 infection as a direct risk to lung health  and safey and to close contacts and should continue to wear a facemask in public and minimize exposure to public locations but especially avoid  any area or activity where non-close contacts are not observing distancing or wearing an appropriate face mask.  I strongly recommended vaccine when offered.    >>> f/u 6 months with pft's assuming covid 19 restrictions lifted.

## 2019-12-10 NOTE — Assessment & Plan Note (Signed)
-   recurrence off gerd rx 07/23/2014 > rec resume rx> resolved - recurrence off gerd rx 03/10/2016 > rec resume rx > resolved  - recurrence off gerd rx 08/26/2018 > rec resume Rx  - Sinus CT 09/09/2018 1. Mild frontal sinus mucosal thickening primarily on the right. 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 2. The remaining paranasal sinuses are well pneumatized. Previous right maxillary antrostomy 05/20/2019 - recurrence off gerd rx > rec resuming rx - Allergy profile 06/09/2019 >  Eos 0.2 /  IgE  6 neg RAST  - 12/09/2019 try singulair 10 mg daily   Flare related to pnds which responds well to prednisone so first try new maint rx with singulair and prn zyrtec then see shoemaker if not better and ? Allergy eval.  Discussed in detail all the  indications, usual  risks and alternatives  relative to the benefits with patient who agrees to proceed with Rx as outlined.      I had an extended discussion with the patient reviewing all relevant studies completed to date and  lasting 15 to 20 minutes of a 25 minute visit    Each maintenance medication was reviewed in detail including most importantly the difference between maintenance and prns and under what circumstances the prns are to be triggered using an action plan format that is not reflected in the computer generated alphabetically organized AVS.     Please see AVS for specific instructions unique to this visit that I personally wrote and verbalized to the the pt in detail and then reviewed with pt  by my nurse highlighting any  changes in therapy recommended at today's visit to their plan of care.

## 2019-12-22 ENCOUNTER — Telehealth: Payer: Self-pay | Admitting: Orthopedic Surgery

## 2019-12-22 NOTE — Telephone Encounter (Signed)
IC s/w patient. Not a surgery candidate In process of dealing with insurance Gel injection not much relief. Lawyer will be reaching out to our office to obtain records.  She wants to make sure that you are in agreement that she had never been treated by you for her knee.  Doing quad strengthening on her legs.

## 2019-12-22 NOTE — Telephone Encounter (Signed)
Patient called.   She didn't get very specific but she wants to speak with one of her providers about her knee.   Call back: [phone]

## 2019-12-23 NOTE — Telephone Encounter (Signed)
yup

## 2020-01-21 ENCOUNTER — Ambulatory Visit: Payer: Medicare Other | Admitting: Orthopedic Surgery

## 2020-01-28 ENCOUNTER — Other Ambulatory Visit: Payer: Self-pay

## 2020-01-28 ENCOUNTER — Telehealth: Payer: Self-pay

## 2020-01-28 ENCOUNTER — Ambulatory Visit: Payer: Medicare Other | Admitting: Orthopedic Surgery

## 2020-01-28 ENCOUNTER — Ambulatory Visit: Payer: Self-pay

## 2020-01-28 DIAGNOSIS — M79641 Pain in right hand: Secondary | ICD-10-CM

## 2020-01-28 NOTE — Telephone Encounter (Signed)
Spoke with patient and Otis Peak w/ [employer], advised last correspondence we have from them was 07/2019. Stated they need to send request for updated records. Otis Peak stated she would work on that. I gave her fax numbers and my email for her to send to.

## 2020-01-28 NOTE — Telephone Encounter (Signed)
Patient was seen today for an appt. She would like to know if her information has been sent to her attorney which is Patterson Hammersmith Please call patient to further advise [phone]

## 2020-01-29 ENCOUNTER — Encounter: Payer: Self-pay | Admitting: Orthopedic Surgery
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
## 2020-03-30 ENCOUNTER — Other Ambulatory Visit: Payer: Self-pay | Admitting: Gastroenterology

## 2020-03-30 DIAGNOSIS — R109 Unspecified abdominal pain: Secondary | ICD-10-CM

## 2020-04-02 ENCOUNTER — Ambulatory Visit
Admission: RE | Admit: 2020-04-02 | Discharge: 2020-04-02 | Disposition: A | Discharge status: Home / Self Care | Payer: Medicare Other | Source: Ambulatory Visit | Attending: Gastroenterology | Admitting: Gastroenterology

## 2020-04-02 DIAGNOSIS — R109 Unspecified abdominal pain: Secondary | ICD-10-CM

## 2020-04-13 ENCOUNTER — Telehealth: Payer: Self-pay | Admitting: Orthopedic Surgery

## 2020-04-13 NOTE — Telephone Encounter (Signed)
Patient called wanting to get a copy of the notes, etc from what Dr. Marlou Sa did to her knee.  [phone].  Thank you.

## 2020-04-14 DIAGNOSIS — M1712 Unilateral primary osteoarthritis, left knee: Secondary | ICD-10-CM | POA: Insufficient documentation

## 2020-04-14 DIAGNOSIS — M1711 Unilateral primary osteoarthritis, right knee: Secondary | ICD-10-CM | POA: Insufficient documentation

## 2020-04-14 NOTE — Telephone Encounter (Signed)
IC, lmam advising need to sign authorization. Once we receive auth, I can call when ready.

## 2020-04-14 NOTE — Telephone Encounter (Signed)
I am assuming she is asking for her records?

## 2020-06-08 ENCOUNTER — Ambulatory Visit: Payer: Medicare Other | Admitting: Adult Health

## 2020-06-08 ENCOUNTER — Ambulatory Visit: Payer: Medicare Other | Admitting: Internal Medicine

## 2020-08-10 ENCOUNTER — Ambulatory Visit: Payer: Medicare Other | Admitting: Internal Medicine

## 2020-09-24 ENCOUNTER — Ambulatory Visit: Payer: Medicare Other | Admitting: Pulmonary Disease

## 2020-09-24 ENCOUNTER — Ambulatory Visit: Payer: Medicare Other | Admitting: Internal Medicine

## 2020-10-19 ENCOUNTER — Encounter: Payer: Self-pay | Admitting: Internal Medicine

## 2020-10-19 ENCOUNTER — Ambulatory Visit: Payer: Medicare Other | Admitting: Internal Medicine

## 2020-10-19 ENCOUNTER — Ambulatory Visit (INDEPENDENT_AMBULATORY_CARE_PROVIDER_SITE_OTHER): Payer: Medicare Other | Admitting: Internal Medicine

## 2020-10-19 ENCOUNTER — Other Ambulatory Visit: Payer: Self-pay

## 2020-10-19 DIAGNOSIS — R918 Other nonspecific abnormal finding of lung field: Secondary | ICD-10-CM

## 2020-10-19 DIAGNOSIS — R059 Cough, unspecified: Secondary | ICD-10-CM

## 2020-10-19 LAB — PULMONARY FUNCTION TEST
DL/VA % pred: 128 %
DL/VA: 5.15 ml/min/mmHg/L
DLCO cor % pred: 100 %
DLCO cor: 23.39 ml/min/mmHg
DLCO unc % pred: 100 %
DLCO unc: 23.39 ml/min/mmHg
FEF 25-75 Post: 1.95 L/sec
FEF 25-75 Pre: 1.75 L/sec
FEF2575-%Change-Post: 11 %
FEF2575-%Pred-Post: 83 %
FEF2575-%Pred-Pre: 74 %
FEV1-%Change-Post: 2 %
FEV1-%Pred-Post: 77 %
FEV1-%Pred-Pre: 75 %
FEV1-Post: 2.22 L
FEV1-Pre: 2.15 L
FEV1FVC-%Change-Post: 0 %
FEV1FVC-%Pred-Pre: 99 %
FEV6-%Change-Post: 1 %
FEV6-%Pred-Post: 80 %
FEV6-%Pred-Pre: 79 %
FEV6-Post: 2.89 L
FEV6-Pre: 2.84 L
FEV6FVC-%Pred-Post: 104 %
FEV6FVC-%Pred-Pre: 104 %
FVC-%Change-Post: 1 %
FVC-%Pred-Post: 77 %
FVC-%Pred-Pre: 76 %
FVC-Post: 2.89 L
FVC-Pre: 2.84 L
Post FEV1/FVC ratio: 77 %
Post FEV6/FVC ratio: 100 %
Pre FEV1/FVC ratio: 76 %
Pre FEV6/FVC Ratio: 100 %
RV % pred: 83 %
RV: 1.99 L
TLC % pred: 84 %
TLC: 4.91 L

## 2020-10-19 MED ORDER — MOMETASONE FURO-FORMOTEROL FUM 100-5 MCG/ACT IN AERO: INHALATION_SPRAY | RESPIRATORY_TRACT | 11 refills | Status: DC

## 2020-10-19 MED ORDER — BUDESONIDE-FORMOTEROL FUMARATE 80-4.5 MCG/ACT IN AERO: INHALATION_SPRAY | RESPIRATORY_TRACT | 12 refills | Status: DC

## 2020-10-19 NOTE — Patient Instructions (Addendum)
dulera 100  Up to 2 puffs every 12 hours if needed    Please schedule a follow up visit in 12  months but call sooner if needed   Late add: advised that regardless of what she's hear about the vaccine,  The delta wave is killing  Relatively younger and somewhat healthier patients than the original virus and that these tragic outcomes are in most cases  preventable by vaccination (records indicate she has refused all vaccinations).

## 2020-10-19 NOTE — Progress Notes (Signed)
Full PFT performed today. °

## 2020-10-19 NOTE — Progress Notes (Signed)
Subjective:   Patient ID: [name], female    DOB: 1951/11/30   MRN: [id number]    Brief patient profile:  89  yowf  MM quit smoking 1979 s sequelae with sinus infections dating back to the 90's with fall> spring pnds s/p sinus surgery in 90's then sick p trip to [place] mid August 3 days after arrival with sinus pain bad cough rx by Ernesto Rutherford with nasty mucus (green) with abx cleared the mucus but worse cough dx as bronchitis > cxr > no acute changes > ct abn so referred by Dr Jackson Latino to pulmonary clinic 09/09/13 with dx of bronchiectasis 09/02/13 assoc with mpns    History of Present Illness  09/09/2013 1st [facility] office visit/ [name]  Chief Complaint  Patient presents with  . Pulmonary Consult    Referred per Dr [name] for eval of abnormal ct chest. Pt c/o dyspnea and pain between shoulder blades x 6 wks.    cp only centered in back not pleuritic but rather positional  Feels heaviness in chest worse first thing in am  Cough is better but still present,  Mostly dry hack and urge to clear throat day > night rec Pantoprazole (protonix) 40 mg   Take 30-60 min before first meal of the day and Pepcid 20 mg one bedtime until return to office - this is the best way to tell whether stomach acid is contributing to your problem.   GERD diet   10/08/2013 f/u ov/[name] re: cough/ sob ? [facility]ACS Chief Complaint  Patient presents with  . Followup with PFT    Back pain is unchanged since last visit. Cough has improved some. Her dyspnea is slightly improved.   although definitely better,  Does not feel any benefit from acid suppression or diet isues, thinks it's all weather/ season related. No more chest heaviness in ams rec Bronchiectasis =   you have scarring of your bronchial tubes  Ok to try off the acid suppression after 4 weeks of therapy to see what difference if any this makes and start back immediately if cough flares for any reason.   07/23/2014 f/u ov/[name] re: recurrent cough, did  not activate action plan  Chief Complaint  Patient presents with  . Acute Visit    Pt c/o trouble taking a deep breath in the morning and intermittnly throughout the day. C/o increase in work of breathing, dry cough and chest heaviness and mid upper back x 1 week.    onset 2 weeks abrupt sensation of palpitations "with skip beat" Then 1 week prior to OV  Sensation of not being able to take a deep breath rx with alb due to findings on pfts but not really much better and caused palp worse so not suing  No change ex tol/ no sob walking, no trouble sitting, sleeping   Problems with speaking with hoarseness, sob  Pain is midline has same pattern and characteristics as prev episodes  rec Pantoprazole (protonix) 40 mg   Take 30-60 min before first meal of the day and Pepcid 20 mg one bedtime until return to office   GERD diet  Best inhaler should you need one to use as needed is  xopenex 1-2 puffs every 4 hours and does not cause heart racing    08/26/2018  Acute/ extended ov/[name] re: cough was resolved  off reflux meds until one month prior to OV ,  Started using some cbd pen  Chief Complaint  Patient presents with  .  Acute Visit    Cough and hoarseness over the past few wks. She is coughing up some clear sputum.   Dyspnea:  Not limited by breathing from desired activities   Cough: daytime > noct/ no am flare/ min clear mucus/ pnds / worse off gerd rx  Sleeping: fine one pillow flat rec Pantoprazole (protonix) 40 mg   Take  30-60 min before first meal of the day and Pepcid (famotidine)  20 mg one @  bedtime until cough is gone for at least a week with the need for cough medications  For cough > delsym 2 tsp every 12 hours as needed  GERD diet  Please see patient coordinator before you leave today  to schedule ct chest and sinus  >>> CT chest 09/09/18 c/w bronchiectasis worse since 01/11/17     . 11/13/2018  f/u ov/[name] re: bronchiectasis  Chief Complaint  Patient presents with  . Follow-up     getting over bronchitis- still coughing up some light yellow sputum.    Dyspnea:  Not limited by breathing from desired activities   Cough: acutely worse around 1st nov 2019 rx by pcp   With head cold / chest cold then  > [name] cefuroxime/ mucinex dm> much better  Still coughing sporadically daytime and occ does wake up/ better p nyquil at hs  Sleeping: bed is flat/ one pillow rec Bronchiectasis =   you have scarring of your bronchial tubes which means that they don't function perfectly normally and mucus tends to pool in certain areas of your lung which can cause pneumonia and further scarring of your lung and bronchial tubes Whenever you develop cough congestion take mucinex or mucinex dm  For cough congestion > mucinex dm 1200 mg every 12 hours and Pantoprazole (protonix) 40 mg   Take  30-60 min before first meal of the day and Pepcid (famotidine)  20 mg one @  bedtime until all better for a week for a week without the need for cough suppression  If need to suppress the cough to sleep prefer you avoid antihistamines and use Tylenol # 3 one half to one at bedtime  Please schedule a follow up visit in 3 months but call sooner if needed -  PFTs on return       Televisit 05/20/2019  Recommendations: Resume protonix daily and famotidine at bedtime Continue Mucinex twice daily Start Flovent inhaler twice daily Continue Albuterol rescue inhaler every 4-6 hours as needed for breakthrough shortness of breath/wheezing  RX: Trial Flovent inhaler, take 2 puffs twice daily      06/09/2019  f/u ov/[name] re: sob in pt bronchiectasis  Chief Complaint  Patient presents with  . Acute Visit    Pt c/o SOB and hoarseness x 2 months.   Dyspnea:  [place] beach "not at my nl pace" feels like gradually losing ground with ex tol / admits becoming much more sedentary since covid 19  Cough: none/some throat congestion but no mucus  Sleeping: flat/ one pillow SABA use: bid ? seems to help  ? rec Stop flovent Start symbicort 80 Take 2 puffs first thing in am and then another 2 puffs about 12 hours later.  - if no better on symbicort then stop all inhalers Only use your albuterol as a rescue medication  Please remember to go to the lab and x-ray department   for your tests - we will call you with the results when they are available. Please schedule a follow  up office visit in 6 weeks, call sooner if needed    12/09/2019  f/u ov/[name] re: bronchiectasis  not maint on symb Chief Complaint  Patient presents with  . Acute Visit    Pt c/o cough and congestion "all year"   Dyspnea:  Limited by knee > breath off all inhalers  Cough: worse nasal/ chest congestion since Jan 2020/ mold under bedroom - prednisone really helped/ minimal mucoid sputum Sleeping: lies flat, wakes up s flare in am ok  SABA use: none 02: none  rec Start singulair 10 mg one daily after supper x one month - do not refill if cough/drainage are not better  = call for allergy eval or refer to [facility]  In meantime zyrtec 10 mg in evening as needed for itchy sneezy nose as needed       10/19/2020  f/u ov/[name] re: bronchiectasis s airflow obst / neg allergy profile 06/09/2019 Chief Complaint  Patient presents with  . Follow-up    go over pft results  Dyspnea:  Back stops her before breathing / worse p eating or speakers Cough: better now /  Sleeping: bed is flat/ one pillow  SABA use: none  02: none    No obvious day to day or daytime variability or assoc excess/ purulent sputum or mucus plugs or hemoptysis or cp or chest tightness, subjective wheeze or overt sinus or hb symptoms.   sleepig  without nocturnal  or early am exacerbation  of respiratory  c/o's or need for noct saba. Also denies any obvious fluctuation of symptoms with weather or environmental changes or other aggravating or alleviating factors except as outlined above   No unusual exposure hx or h/o childhood pna/ asthma or knowledge of  premature birth.  Current Allergies, Complete Past Medical History, Past Surgical History, Family History, and Social History were reviewed in [employer] record.  ROS  The following are not active complaints unless bolded Hoarseness, sore throat, dysphagia, dental problems, itching, sneezing,  nasal congestion or discharge of excess mucus or purulent secretions, ear ache,   fever, chills, sweats, unintended wt loss or wt gain, classically pleuritic or exertional cp,  orthopnea pnd or arm/hand swelling  or leg swelling, presyncope, palpitations, abdominal pain, anorexia, nausea, vomiting, diarrhea  or change in bowel habits or change in bladder habits, change in stools or change in urine, dysuria, hematuria,  rash, arthralgias, visual complaints, headache, numbness, weakness or ataxia or problems with walking or coordination,  change in mood or  memory.        Current Meds  Medication Sig  . ALPRAZolam (XANAX) 0.5 MG tablet Take 0.5 mg by mouth 3 (three) times daily as needed.   . Ascorbic Acid (VITAMIN C) 1000 MG tablet Take 1,000 mg by mouth daily.  Marland Kitchen atenolol (TENORMIN) 25 MG tablet TK 1 T PO QD.  . butalbital-acetaminophen-caffeine (FIORICET, ESGIC) 50-325-40 MG per tablet Take 1 tablet by mouth every 4 (four) hours as needed for headache or migraine.   . Cholecalciferol ([employer] VIT D 5000 HIGH-POTENCY PO) Take by mouth.  . Estradiol (DIVIGEL) 1 MG/GM GEL Place 1 application onto the skin every morning.  . NONFORMULARY OR COMPOUNDED ITEM Apply 1 application topically at bedtime. Progesterone  . pantoprazole (PROTONIX) 40 MG tablet TAKE 1 TABLET(40 MG) BY MOUTH DAILY  . Polyethyl Glycol-Propyl Glycol (SYSTANE FREE OP) Apply to eye.  . therapeutic multivitamin-minerals (THERAGRAN-M) tablet Take 1 tablet by mouth daily.  . traMADol (ULTRAM) 50 MG tablet  TAKE 1 TABLET BY MOUTH EVERY 6 HOURS AS NEEDED FOR SEVERE PAIN( MAX OF 20 TABS PER MONTH)  . UNABLE TO FIND Med Name: Omega  3  . zinc gluconate 50 MG tablet Take 50 mg by mouth daily.  .                Objective:   Physical Exam   10/19/2020        157  12/09/2019      161  07/23/2014        160  > 03/10/2016   161 >  11/13/2018  162     10/08/13 160 lb (72.576 kg)  09/09/13 162 lb 12.8 oz (73.846 kg)  04/14/13 161 lb (73.029 kg)    Vital signs reviewed  10/19/2020  - Note at rest 02 sats  98% on RA    Ambulatory wf nad    HEENT : pt wearing mask not removed for exam due to covid -19 concerns.    NECK :  without JVD/Nodes/TM/ nl carotid upstrokes bilaterally   LUNGS: no acc muscle use,  Nl contour chest which is clear to A and P bilaterally without cough on insp or exp maneuvers   CV:  RRR  no s3 or murmur or increase in P2, and no edema   ABD:  soft and nontender with nl inspiratory excursion in the supine position. No bruits or organomegaly appreciated, bowel sounds nl  MS:  Nl gait/ ext warm without deformities, calf tenderness, cyanosis or clubbing No obvious joint restrictions   SKIN: warm and dry without lesions    NEURO:  alert, approp, nl sensorium with  no motor or cerebellar deficits apparent.         Assessment & Plan:

## 2020-10-19 NOTE — Assessment & Plan Note (Addendum)
DX by  CT chest 2006 and 09/02/13 1. No acute cardiopulmonary abnormalities.  2. Spectrum of findings within the lungs, including peripheral  tree-in-bud nodularity and bronchiectasis, favor sequelae of chronic  indolent/atypical infection. 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  3. Small pericardial effusion PFT 10/08/13 wnl  - CT abd 02/29/16 Lower chest: In the inferior aspect of the lingula and anterior left lower lobe, there are multiple small indeterminate pulmonary nodules. Largest has mean diameter of 6 mm on image 7 of series 5. These are new since 2010 exam -CT chest  repeat 01/12/16 Multiple bilateral pulmonary nodules. When compared to chest CT 09/02/2013, there is a new nodule within the right middle lobe, left upper lobe and superior segment right lower lobe. The largest within the right lower lobe has a mean dimension of 9 mm. Non-contrast chest CT at 3-6 months is recommended> rec 6  m study in reminder file > did not do  - repeat CT  09/09/2018 >>> 1. 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 Findings overall mildly worsened since 01/11/2017 chest CT. 2. Stable chronic small pericardial effusion/thickening. 3. One vessel coronary atherosclerosis  06/09/2019  After extensive coaching inhaler device,  effectiveness =    90% > start symb 80 2bid 06/09/2019  Alpha one AT  MM 06/09/2019  Quant Ig's  wnl PFT's  10/19/2020  FEV1 2.22 (77 % ) ratio 0.77  p 2 % improvement from saba p 0 prior to study with DLCO  23.39 (100%) corrects to 5.15 (126%)  for alv volume and FV curve nl    She has minimal assoc AB which can be controlled with symbiort 80 "prn"  Based on two studies from NEJM  378; 20 p 1865 (2018)  and 380 : p2020-30 (2019) in pts with mild asthma it is reasonable to use low dose symbicort eg 80 2bid "prn" flare in this setting but I emphasized this was only shown with symbicort and takes advantage of the rapid onset of action but is not the same as "rescue therapy" but can be stopped once the acute symptoms have resolved and the need for rescue has been minimized (< 2 x weekly)     F/u can be yearly - sooner prn          Each maintenance medication was reviewed in detail including emphasizing most importantly the difference between maintenance and prns and under what circumstances the prns are to be triggered using an action plan format where appropriate.  Total time for H and P, chart review, counseling, teaching device and generating customized AVS unique to this office visit / charting = 25 min

## 2020-10-20 ENCOUNTER — Telehealth: Payer: Self-pay | Admitting: Internal Medicine

## 2020-10-21 MED ORDER — BUDESONIDE-FORMOTEROL FUMARATE 80-4.5 MCG/ACT IN AERO: 2.0000 | INHALATION_SPRAY | Freq: Two times a day (BID) | RESPIRATORY_TRACT | 5 refills | Status: DC

## 2020-10-21 NOTE — Telephone Encounter (Signed)
Spoke with pt, states that her Ruthe Mannan is over $100/30 day supply which she cannot afford.  She is going out of town today for the weekend so she purchased the inhaler, but states that she needs a more affordable option.   Per covermymeds, Ruthe Mannan is not covered by insurance.  Preferred drugs are Symbicort, Spiriva, Trelegy, Anoro, Breztri.  Pt has recently been on Symbicort, states she doesn't remember if he helped with her respiratory s/s.    MW please advise on which inhaler you prefer as an alternative to [facility].  Thanks!   (note to triage- pt requests we leave a detailed VM keeping her updated on new inhaler, since she will be out of town this weekend).

## 2020-10-21 NOTE — Telephone Encounter (Signed)
Symbicort sent to pharmacy. Med list updated to reflect change. Left detailed message at pt's request explaining med change. Nothing further needed at this time- will close encounter.

## 2020-10-21 NOTE — Telephone Encounter (Signed)
symbicort 80 Take 2 puffs first thing in am and then another 2 puffs about 12 hours later.    she should ask for generic as it's made by the same company just a different label

## 2020-12-21 ENCOUNTER — Telehealth: Payer: Self-pay | Admitting: Internal Medicine

## 2020-12-21 DIAGNOSIS — R0602 Shortness of breath: Secondary | ICD-10-CM

## 2020-12-21 DIAGNOSIS — R059 Cough, unspecified: Secondary | ICD-10-CM

## 2020-12-21 NOTE — Telephone Encounter (Signed)
Yes that's fine 

## 2020-12-21 NOTE — Telephone Encounter (Signed)
Called and spoke with patient who states that her ENT doctor Dr. Pixie Casino retired and at her last OV she talked to Dr. Melvyn Novas and got his recommendations on who she should go and see. She now has an appointment with Dr. Wilburn Cornelia on 01/10/21 and they are requesting a referral from our office.   Dr. Melvyn Novas are you ok with [place] putting in referral for this patient

## 2020-12-22 NOTE — Telephone Encounter (Signed)
Order placed  Patient has appointment with Dr. Wilburn Cornelia at the end of the month.  Nothing further needed at this time.

## 2021-01-10 DIAGNOSIS — H6993 Unspecified Eustachian tube disorder, bilateral: Secondary | ICD-10-CM | POA: Insufficient documentation

## 2021-01-19 ENCOUNTER — Telehealth: Payer: Self-pay | Admitting: Internal Medicine

## 2021-01-19 ENCOUNTER — Ambulatory Visit: Payer: Self-pay | Admitting: Internal Medicine

## 2021-01-19 NOTE — Telephone Encounter (Signed)
Using flovent and albuterol Using the spacer She is feeling very winded and SHOB with exertion Hoarseness in her throat but not really coughing  She was positive 12/27  Pt scheduled for televisit with MW for further recs on 02/10 at 845

## 2021-01-20 ENCOUNTER — Encounter: Payer: Self-pay | Admitting: Internal Medicine

## 2021-01-20 ENCOUNTER — Other Ambulatory Visit: Payer: Self-pay

## 2021-01-20 ENCOUNTER — Ambulatory Visit (INDEPENDENT_AMBULATORY_CARE_PROVIDER_SITE_OTHER): Payer: Medicare Other | Admitting: Internal Medicine

## 2021-01-20 DIAGNOSIS — U071 COVID-19: Secondary | ICD-10-CM | POA: Diagnosis not present

## 2021-01-20 DIAGNOSIS — R918 Other nonspecific abnormal finding of lung field: Secondary | ICD-10-CM

## 2021-01-20 NOTE — Progress Notes (Signed)
Subjective:   Patient ID: [name], female    DOB: 1951/11/30   MRN: [id number]    Brief patient profile:  89  yowf  MM quit smoking 1979 s sequelae with sinus infections dating back to the 90's with fall> spring pnds s/p sinus surgery in 90's then sick p trip to [place] mid August 3 days after arrival with sinus pain bad cough rx by Ernesto Rutherford with nasty mucus (green) with abx cleared the mucus but worse cough dx as bronchitis > cxr > no acute changes > ct abn so referred by Dr Jackson Latino to pulmonary clinic 09/09/13 with dx of bronchiectasis 09/02/13 assoc with mpns    History of Present Illness  09/09/2013 1st [facility] office visit/ [facility]  Chief Complaint  Patient presents with  . Pulmonary Consult    Referred per Dr [name] for eval of abnormal ct chest. Pt c/o dyspnea and pain between shoulder blades x 6 wks.    cp only centered in back not pleuritic but rather positional  Feels heaviness in chest worse first thing in am  Cough is better but still present,  Mostly dry hack and urge to clear throat day > night rec Pantoprazole (protonix) 40 mg   Take 30-60 min before first meal of the day and Pepcid 20 mg one bedtime until return to office - this is the best way to tell whether stomach acid is contributing to your problem.   GERD diet   10/08/2013 f/u ov/Wert re: cough/ sob ? UACS Chief Complaint  Patient presents with  . Followup with PFT    Back pain is unchanged since last visit. Cough has improved some. Her dyspnea is slightly improved.   although definitely better,  Does not feel any benefit from acid suppression or diet isues, thinks it's all weather/ season related. No more chest heaviness in ams rec Bronchiectasis =   you have scarring of your bronchial tubes  Ok to try off the acid suppression after 4 weeks of therapy to see what difference if any this makes and start back immediately if cough flares for any reason.   07/23/2014 f/u ov/Wert re: recurrent cough, did  not activate action plan  Chief Complaint  Patient presents with  . Acute Visit    Pt c/o trouble taking a deep breath in the morning and intermittnly throughout the day. C/o increase in work of breathing, dry cough and chest heaviness and mid upper back x 1 week.    onset 2 weeks abrupt sensation of palpitations "with skip beat" Then 1 week prior to OV  Sensation of not being able to take a deep breath rx with alb due to findings on pfts but not really much better and caused palp worse so not suing  No change ex tol/ no sob walking, no trouble sitting, sleeping   Problems with speaking with hoarseness, sob  Pain is midline has same pattern and characteristics as prev episodes  rec Pantoprazole (protonix) 40 mg   Take 30-60 min before first meal of the day and Pepcid 20 mg one bedtime until return to office   GERD diet  Best inhaler should you need one to use as needed is  xopenex 1-2 puffs every 4 hours and does not cause heart racing    08/26/2018  Acute/ extended ov/Wert re: cough was resolved  off reflux meds until one month prior to OV ,  Started using some cbd pen  Chief Complaint  Patient presents with  .  Acute Visit    Cough and hoarseness over the past few wks. She is coughing up some clear sputum.   Dyspnea:  Not limited by breathing from desired activities   Cough: daytime > noct/ no am flare/ min clear mucus/ pnds / worse off gerd rx  Sleeping: fine one pillow flat rec Pantoprazole (protonix) 40 mg   Take  30-60 min before first meal of the day and Pepcid (famotidine)  20 mg one @  bedtime until cough is gone for at least a week with the need for cough medications  For cough > delsym 2 tsp every 12 hours as needed  GERD diet  Please see patient coordinator before you leave today  to schedule ct chest and sinus  >>> CT chest 09/09/18 c/w bronchiectasis worse since 01/11/17     . 11/13/2018  f/u ov/Wert re: bronchiectasis  Chief Complaint  Patient presents with  . Follow-up     getting over bronchitis- still coughing up some light yellow sputum.    Dyspnea:  Not limited by breathing from desired activities   Cough: acutely worse around 1st nov 2019 rx by pcp   With head cold / chest cold then  > [name] cefuroxime/ mucinex dm> much better  Still coughing sporadically daytime and occ does wake up/ better p nyquil at hs  Sleeping: bed is flat/ one pillow rec Bronchiectasis =   you have scarring of your bronchial tubes which means that they don't function perfectly normally and mucus tends to pool in certain areas of your lung which can cause pneumonia and further scarring of your lung and bronchial tubes Whenever you develop cough congestion take mucinex or mucinex dm  For cough congestion > mucinex dm 1200 mg every 12 hours and Pantoprazole (protonix) 40 mg   Take  30-60 min before first meal of the day and Pepcid (famotidine)  20 mg one @  bedtime until all better for a week for a week without the need for cough suppression  If need to suppress the cough to sleep prefer you avoid antihistamines and use Tylenol # 3 one half to one at bedtime  Please schedule a follow up visit in 3 months but call sooner if needed -  PFTs on return       Televisit 05/20/2019  Recommendations: Resume protonix daily and famotidine at bedtime Continue Mucinex twice daily Start Flovent inhaler twice daily Continue Albuterol rescue inhaler every 4-6 hours as needed for breakthrough shortness of breath/wheezing  RX: Trial Flovent inhaler, take 2 puffs twice daily      06/09/2019  f/u ov/Wert re: sob in pt bronchiectasis  Chief Complaint  Patient presents with  . Acute Visit    Pt c/o SOB and hoarseness x 2 months.   Dyspnea:  [place] on beach "not at my nl pace" feels like gradually losing ground with ex tol / admits becoming much more sedentary since covid 19  Cough: none/some throat congestion but no mucus  Sleeping: flat/ one pillow SABA use: bid ? seems to help  ? rec Stop flovent Start symbicort 80 Take 2 puffs first thing in am and then another 2 puffs about 12 hours later.  - if no better on symbicort then stop all inhalers Only use your albuterol as a rescue medication  Please remember to go to the lab and x-ray department   for your tests - we will call you with the results when they are available. Please schedule a follow  up office visit in 6 weeks, call sooner if needed    12/09/2019  f/u ov/Wert re: bronchiectasis  not maint on symb Chief Complaint  Patient presents with  . Acute Visit    Pt c/o cough and congestion "all year"   Dyspnea:  Limited by knee > breath off all inhalers  Cough: worse nasal/ chest congestion since Jan 2020/ mold under bedroom - prednisone really helped/ minimal mucoid sputum Sleeping: lies flat, wakes up s flare in am ok  SABA use: none 02: none  rec Start singulair 10 mg one daily after supper x one month - do not refill if cough/drainage are not better  = call for allergy eval or refer to [facility]  In meantime zyrtec 10 mg in evening as needed for itchy sneezy nose as needed       10/19/2020  f/u ov/Wert re: bronchiectasis s airflow obst / neg allergy profile 06/09/2019 Chief Complaint  Patient presents with  . Follow-up    go over pft results  Dyspnea:  Back stops her before breathing / worse p eating or speaks Cough: better now /  Sleeping: bed is flat/ one pillow  SABA use: none  02: none  rec Dulera 100  Up to 2 puffs every 12 hours if needed  Please schedule a follow up visit in 12  months but call sooner if needed    12/02/20 no resp treatments  12/03/20  vomiting then body aches, then cough yellow mucus tested pos on 12/06/20 took her own intermectic/ rx pred/zpak then UC in [place] doxy / pred and all better x for sob that got worse in pm > advere eval end of Jan 2022 better p neb       Virtual Visit via Telephone Note 01/20/2021   I connected with [place] on 01/20/21 at   8:45 AM EST by telephone and verified that I am speaking with the correct person using two identifiers. Pt is at home and this call made from my office with no other participants    I discussed the limitations, risks, security and privacy concerns of performing an evaluation and management service by telephone and the availability of in person appointments. I also discussed with the patient that there may be a patient responsible charge related to this service. The patient expressed understanding and agreed to proceed.   History of Present Illness: Dyspnea:   Not reproducible Cough: min hoarseness/ off gerd rx  Sleeping: 20 degrees, no noct symptoms SABA use:"flovent not working" / neb from PCP did  02: sats 95% at rest, has not checked   No obvious patterns in  day to day or daytime variability or assoc excess/ purulent sputum or mucus plugs or hemoptysis or cp or chest tightness, subjective wheeze or overt sinus or hb symptoms.    Also denies any obvious fluctuation of symptoms with weather or environmental changes or other aggravating or alleviating factors except as outlined above.   Meds reviewed/ med reconciliation completed     Current Meds  Medication Sig  . albuterol (VENTOLIN HFA) 108 (90 Base) MCG/ACT inhaler   . ALPRAZolam (XANAX) 0.5 MG tablet Take 0.5 mg by mouth 3 (three) times daily as needed.   . Ascorbic Acid (VITAMIN C) 1000 MG tablet Take 1,000 mg by mouth daily.  Marland Kitchen atenolol (TENORMIN) 25 MG tablet 25 mg daily. Only takes as needed  . Bioflavonoid Products (QUERCETIN COMPLEX IMMUNE) CAPS Take 1 capsule by mouth. Takes 1,000mg  every other  day  . butalbital-acetaminophen-caffeine (FIORICET, ESGIC) 50-325-40 MG per tablet Take 1 tablet by mouth every 4 (four) hours as needed for headache or migraine.  . Cholecalciferol ([employer] VIT D 5000 HIGH-POTENCY PO) Take by mouth.  . Estradiol 1 MG/GM GEL Place 1 application onto the skin every morning.  Marland Kitchen FLOVENT HFA 220 MCG/ACT  inhaler Inhale 2 puffs into the lungs 2 (two) times daily.  . NONFORMULARY OR COMPOUNDED ITEM Apply 1 application topically at bedtime. Progesterone  . Polyethyl Glycol-Propyl Glycol (SYSTANE FREE OP) Apply to eye.  . therapeutic multivitamin-minerals (THERAGRAN-M) tablet Take 1 tablet by mouth daily.  . traMADol (ULTRAM) 50 MG tablet TAKE 1 TABLET BY MOUTH EVERY 6 HOURS AS NEEDED FOR SEVERE PAIN( MAX OF 20 TABS PER MONTH)  . traZODone (DESYREL) 50 MG tablet Patient takes 0.5 tablet daily  . UNABLE TO FIND Med Name: Omega 3  . zinc gluconate 50 MG tablet Take 50 mg by mouth daily.         Observations/Objective: No conversational sob/ nl voice texture      Assessment and Plan: See problem list for active a/p's   Follow Up Instructions: See avs for instructions unique to this ov which includes revised/ updated med list     I discussed the assessment and treatment plan with the patient. The patient was provided an opportunity to ask questions and all were answered. The patient agreed with the plan and demonstrated an understanding of the instructions.   The patient was advised to call back or seek an in-person evaluation if the symptoms worsen or if the condition fails to improve as anticipated.  I provided 30 minutes of non-face-to-face time during this encounter.   Christinia Gully, MD

## 2021-01-20 NOTE — Patient Instructions (Signed)
Plan A = Automatic = Always=   Dulera 100 2 puffs every 12 hours for a full week then ok to use as needed   Plan B = Backup (to supplement plan A, not to replace it) Only use your albuterol inhaler as a rescue medication to be used if you can't catch your breath by resting or doing a relaxed purse lip breathing pattern.  - The less you use it, the better it will work when you need it. - Ok to use the inhaler up to 2 puffs  every 4 hours if you must but call for appointment if use goes up over your usual need - Don't leave home without it !!  (think of it like the spare tire for your car)   Try albuterol 15 min before an activity that you know would make you short of breath and see if it makes any difference and if makes none then don't take it after activity unless you can't catch your breath.     If not improving after a few days, then restart protonix 40 mg Take 30- 60 min before your first and last meals of the day    Make sure you check your oxygen saturation at your highest level of activity to be sure it stays over 90% and keep track of it at least once a week, more often if breathing getting worse, and let me know if losing ground.

## 2021-01-20 NOTE — Assessment & Plan Note (Signed)
DX by  CT chest 2006 and 09/02/13 1. No acute cardiopulmonary abnormalities.  2. Spectrum of findings within the lungs, including peripheral  tree-in-bud nodularity and bronchiectasis, favor sequelae of chronic  indolent/atypical infection. 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  3. Small pericardial effusion PFT 10/08/13 wnl  - CT abd 02/29/16 Lower chest: In the inferior aspect of the lingula and anterior left lower lobe, there are multiple small indeterminate pulmonary nodules. Largest has mean diameter of 6 mm on image 7 of series 5. These are new since 2010 exam -CT chest  repeat 01/12/16 Multiple bilateral pulmonary nodules. When compared to chest CT 09/02/2013, there is a new nodule within the right middle lobe, left upper lobe and superior segment right lower lobe. The largest within the right lower lobe has a mean dimension of 9 mm. Non-contrast chest CT at 3-6 months is recommended> rec 6  m study in reminder file > did not do  - repeat CT  09/09/2018 >>> 1. 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 Findings overall mildly worsened since 01/11/2017 chest CT. 2. Stable chronic small pericardial effusion/thickening. 3. One vessel coronary atherosclerosis  06/09/2019  After extensive coaching inhaler device,  effectiveness =    90% > start symb 80 2bid 06/09/2019  Alpha one AT  MM 06/09/2019  Quant Ig's  wnl PFT's  10/19/2020  FEV1 2.22 (77 % ) ratio 0.77  p 2 % improvement from saba p 0 prior to study with DLCO  23.39 (100%) corrects to 5.15 (126%)  for alv volume and FV curve nl    Now reports sob better with saba p covid so rec restart dulera 100 2bid for at least a week with approp saba  I spent extra  time with pt today reviewing appropriate use of albuterol for prn use on exertion with the following points: 1) saba is for relief of sob that does not improve by walking a slower pace or resting but rather if the pt does not improve after trying this first. 2) If the pt is convinced, as many are, that saba helps recover from activity faster then it's easy to tell if this is the case by re-challenging : ie stop, take the inhaler, then p 5 minutes try the exact same activity (intensity of workload) that just caused the symptoms and see if they are substantially diminished or not after saba 3) if there is an activity that reproducibly causes the symptoms, try the saba 15 min before the activity on alternate days   If in fact the saba really does help, then fine to continue to use it prn but advised may need to look closer at the maintenance regimen being used to achieve better control of airways disease with exertion.

## 2021-01-20 NOTE — Assessment & Plan Note (Addendum)
Never vaccinated/ onset of symptoms 12/03/20 whereas previously not limited by sob or needing inhalers  - self rx invermectim 12/06/20 x 5 d - 01/20/2021 reporting brain fog   Rec: Make sure you check your oxygen saturation at your highest level of activity to be sure it stays over 90% and keep track of it at least once a week, more often if breathing getting worse, and let me know if losing ground.   F/u prn p one week of above rx  Each maintenance medication was reviewed in detail including most importantly the difference between maintenance and as needed and under what circumstances the prns are to be used.  Please see AVS for specific  Instructions which are unique to this visit and I personally typed out  which were reviewed in detail over the phone with the patient and a copy provided via mychart

## 2021-01-26 ENCOUNTER — Telehealth: Payer: Self-pay | Admitting: Internal Medicine

## 2021-01-26 MED ORDER — PANTOPRAZOLE SODIUM 40 MG PO TBEC
DELAYED_RELEASE_TABLET | ORAL | 1 refills | Status: DC
Start: 2021-01-26 — End: 2023-11-21

## 2021-01-26 NOTE — Telephone Encounter (Signed)
Called and spoke with patient who states that she needs a refill of her Protonix sent into preferred pharmacy because her breathing is not better. She verified pharmacy. Rx has been sent in per AVS from 01/20/21. Nothing further needed at this time.

## 2021-03-07 ENCOUNTER — Ambulatory Visit: Payer: Medicare Other | Admitting: Orthopedic Surgery

## 2021-05-31 ENCOUNTER — Telehealth: Payer: Self-pay | Admitting: Orthopedic Surgery

## 2021-05-31 NOTE — Telephone Encounter (Signed)
Patient called asked if [name] would call her concerning her knee situation? Patient said she needed to reschedule her appointment because she is getting over Covid-19. Patient said she just wanted to talk to [place]. The number to contact patient is [phone]

## 2021-06-01 ENCOUNTER — Ambulatory Visit: Payer: Medicare Other | Admitting: Orthopedic Surgery

## 2021-06-01 NOTE — Telephone Encounter (Signed)
Tried calling patient. No answer. LMVM advising was returning call and could call back to discuss.

## 2021-06-16 ENCOUNTER — Ambulatory Visit: Payer: Medicare Other | Admitting: Orthopedic Surgery

## 2021-06-16 ENCOUNTER — Ambulatory Visit: Payer: Self-pay

## 2021-06-16 DIAGNOSIS — M1711 Unilateral primary osteoarthritis, right knee: Secondary | ICD-10-CM | POA: Diagnosis not present

## 2021-06-18 ENCOUNTER — Encounter: Payer: Self-pay | Admitting: Orthopedic Surgery
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
## 2021-12-21 ENCOUNTER — Telehealth: Payer: Self-pay | Admitting: Orthopedic Surgery

## 2021-12-21 NOTE — Telephone Encounter (Signed)
Received call from patient needing records from 2015 hip surgery. After advising the need for signed release, she asked form to be mailed to her which I have. She would like to be able to pick records up next Friday.

## 2022-02-01 ENCOUNTER — Telehealth: Payer: Self-pay | Admitting: Orthopedic Surgery

## 2022-02-01 NOTE — Telephone Encounter (Signed)
Received call from pt. She stated she did not receive the 2015 op note in the records I mailed to her. Dates she indicated on her release form is 2016-2017. So the records would not have included 2015. I advised pt I am mailing the 2015 OP note.copy placed in the mail

## 2022-02-06 ENCOUNTER — Other Ambulatory Visit: Payer: Self-pay | Admitting: Internal Medicine

## 2022-02-14 ENCOUNTER — Other Ambulatory Visit: Payer: Self-pay

## 2022-02-15 LAB — CBC WITH DIFFERENTIAL/PLATELET
Basophils Absolute: 0 10*3/uL (ref 0.0–0.2)
Basos: 0 %
EOS (ABSOLUTE): 0 10*3/uL (ref 0.0–0.4)
Eos: 0 %
Hematocrit: 37.8 % (ref 34.0–46.6)
Hemoglobin: 13.1 g/dL (ref 11.1–15.9)
Immature Grans (Abs): 0 10*3/uL (ref 0.0–0.1)
Immature Granulocytes: 1 %
Lymphocytes Absolute: 1.8 10*3/uL (ref 0.7–3.1)
Lymphs: 25 %
MCH: 32.5 pg (ref 26.6–33.0)
MCHC: 34.7 g/dL (ref 31.5–35.7)
MCV: 94 fL (ref 79–97)
Monocytes Absolute: 0.7 10*3/uL (ref 0.1–0.9)
Monocytes: 9 %
Neutrophils Absolute: 4.7 10*3/uL (ref 1.4–7.0)
Neutrophils: 65 %
Platelets: 211 10*3/uL (ref 150–450)
RBC: 4.03 x10E6/uL (ref 3.77–5.28)
RDW: 12.2 % (ref 11.7–15.4)
WBC: 7.2 10*3/uL (ref 3.4–10.8)

## 2022-02-15 LAB — COMPREHENSIVE METABOLIC PANEL
ALT: 20 IU/L (ref 0–32)
AST: 19 IU/L (ref 0–40)
Albumin/Globulin Ratio: 2 (ref 1.2–2.2)
Albumin: 4.5 g/dL (ref 3.8–4.8)
Alkaline Phosphatase: 76 IU/L (ref 44–121)
BUN/Creatinine Ratio: 18 (ref 12–28)
BUN: 13 mg/dL (ref 8–27)
Bilirubin Total: 0.4 mg/dL (ref 0.0–1.2)
CO2: 25 mmol/L (ref 20–29)
Calcium: 9.4 mg/dL (ref 8.7–10.3)
Chloride: 96 mmol/L (ref 96–106)
Creatinine, Ser: 0.71 mg/dL (ref 0.57–1.00)
Globulin, Total: 2.2 g/dL (ref 1.5–4.5)
Glucose: 97 mg/dL (ref 70–99)
Potassium: 4.3 mmol/L (ref 3.5–5.2)
Sodium: 133 mmol/L — ABNORMAL LOW (ref 134–144)
Total Protein: 6.7 g/dL (ref 6.0–8.5)
eGFR: 93 mL/min/{1.73_m2} (ref >59)

## 2022-02-15 LAB — FOLATE: Folate: >20 ng/mL (ref >3.0)

## 2022-02-15 LAB — LIPID PANEL W/O CHOL/HDL RATIO
Cholesterol, Total: 266 mg/dL — ABNORMAL HIGH (ref 100–199)
HDL: 130 mg/dL (ref >39)
LDL Chol Calc (NIH): 128 mg/dL — ABNORMAL HIGH (ref 0–99)
Triglycerides: 54 mg/dL (ref 0–149)
VLDL Cholesterol Cal: 8 mg/dL (ref 5–40)

## 2022-02-15 LAB — VITAMIN D 25 HYDROXY (VIT D DEFICIENCY, FRACTURES): Vit D, 25-Hydroxy: 69.3 ng/mL (ref 30.0–100.0)

## 2022-02-15 LAB — TSH: TSH: 3.06 u[IU]/mL (ref 0.450–4.500)

## 2022-02-15 LAB — VITAMIN B12: Vitamin B-12: 722 pg/mL (ref 232–1245)

## 2022-04-10 ENCOUNTER — Ambulatory Visit: Payer: Medicare Other | Admitting: Cardiology

## 2022-04-30 ENCOUNTER — Encounter: Payer: Self-pay | Admitting: Cardiology

## 2022-04-30 NOTE — Progress Notes (Unsigned)
Cardiology Office Note   Date:  05/02/2022   ID:  Deliyah, Muckle [birth year], MRN [id number]  PCP:  Nolene Ebbs, MD  Cardiologist:   None Referring:  Nolene Ebbs, MD  Chief Complaint  Patient presents with   Palpitations            History of Present Illness: [name] is a 71 y.o. female who presents for evaluation of palpitations.  She has previously seen Dr. Mare Ferrari before he retired.  She was seeing another cardiologist in [facility] but was not satisfied.  I was able to review extensive notes.  She had palpitations.  These were managed with a low-dose of beta-blocker.  She mostly felt this at night when she would lie on her left side.  She seems to have done well with atenolol over time.  She also has a heart murmur.  There was echocardiography dating back to 2012 and I see one from 2021 that demonstrated aortic sclerosis.  There were no other significant valvular abnormalities and she had a well-preserved ejection fraction.  I do also note that she has had coronary calcium noted on CT.  I see 1 from 2019.  She does not however describe chest discomfort.  She walks about 3 times per week 30 minutes.  She is trying to work up a little bit longer.  She does not describe chest discomfort, neck or arm discomfort.  She is limited by some knee pain and so she has been able to get herself as much as she would like.  However, she has not she does not think limited by breathing or discomfort.  She does not describe PND or orthopnea.  She was in the emergency room in [facility] in November.  I reviewed these records for this appointment.  She had hypertensive urgency.  She had shortness of breath.  She had chest discomfort.  She reports her blood pressure was 220/110.  There were no acute EKG changes.  Enzymes were negative.  Chest x-ray was unremarkable except for some clustered micro nodularity in the right midlung.   Past Medical History:  Diagnosis Date    Arthritis    lower back   Carpal tunnel syndrome of right wrist    Complication of anesthesia    felt funny afterward, sleep did help some   Dysrhythmia    rapid heart rate at night   Headache(784.0)    migraines   Hepatitis 12/11/1974   hep A   Palpitation     Past Surgical History:  Procedure Laterality Date   ABDOMINAL HYSTERECTOMY     BACK SURGERY     x 2   COLONOSCOPY     ELBOW SURGERY Right 2008   tennis elbow   EXCISION/RELEASE BURSA HIP Right 02/17/2014   Procedure: EXCISION/RELEASE BURSA HIP;  Surgeon: Meredith Pel, MD;  Location: [facility];  Service: Orthopedics;  Laterality: Right;  RIGHT HIP TENSOR FASCIAL LATERAL RELEASE.   NASAL SINUS SURGERY     PARTIAL HYSTERECTOMY       Current Outpatient Medications  Medication Sig Dispense Refill   albuterol (VENTOLIN HFA) 108 (90 Base) MCG/ACT inhaler      ALPRAZolam (XANAX) 0.5 MG tablet Take 0.5 mg by mouth 3 (three) times daily as needed.      Ascorbic Acid (VITAMIN C) 1000 MG tablet Take 1,000 mg by mouth daily.     Bioflavonoid Products (QUERCETIN COMPLEX IMMUNE) CAPS Take 1 capsule by mouth. Takes 1,'000mg'$   every other day     butalbital-acetaminophen-caffeine (FIORICET, ESGIC) 50-325-40 MG per tablet Take 1 tablet by mouth every 4 (four) hours as needed for headache or migraine.     Cholecalciferol ([employer]S VIT D 5000 HIGH-POTENCY PO) Take by mouth.     Estradiol 1 MG/GM GEL Place 1 application onto the skin every morning.     FLOVENT HFA 220 MCG/ACT inhaler Inhale 2 puffs into the lungs 2 (two) times daily.     NONFORMULARY OR COMPOUNDED ITEM Apply 1 application topically at bedtime. Progesterone     therapeutic multivitamin-minerals (THERAGRAN-M) tablet Take 1 tablet by mouth daily.     traMADol (ULTRAM) 50 MG tablet TAKE 1 TABLET BY MOUTH EVERY 6 HOURS AS NEEDED FOR SEVERE PAIN( MAX OF 20 TABS PER MONTH) 20 tablet 0   traZODone (DESYREL) 50 MG tablet Patient takes 0.5 tablet daily     UNABLE TO FIND Med Name: Omega  3     zinc gluconate 50 MG tablet Take 50 mg by mouth daily.     atenolol (TENORMIN) 25 MG tablet Take 1 tablet (25 mg total) by mouth daily. 90 tablet 3   famotidine (PEPCID) 20 MG tablet Take 20 mg by mouth daily.     mometasone-formoterol (DULERA) 100-5 MCG/ACT AERO INHALE 2 PUFFS BY MOUTH FIRST THING IN THE MORNING AND THEN 2 PUFFS BY MOUTH 12HOURS LATER (Patient not taking: Reported on 05/02/2022) 13 g 0   pantoprazole (PROTONIX) 40 MG tablet TAKE 1 TABLET(40 MG) BY MOUTH DAILY (Patient not taking: Reported on 05/02/2022) 90 tablet 1   No current facility-administered medications for this visit.    Allergies:   Diflucan [fluconazole]    Social History:  The patient  reports that she quit smoking about 44 years ago. Her smoking use included cigarettes. She has a 2.25 pack-year smoking history. She has never used smokeless tobacco. She reports that she does not drink alcohol and does not use drugs.   Family History:  The patient's family history includes Emphysema in her father; Kidney failure in her mother; Lung cancer in her father.    ROS:  Please see the history of present illness.   Otherwise, review of systems are positive for none.   All other systems are reviewed and negative.    PHYSICAL EXAM: VS:  BP 138/74 (BP Location: Left Arm, Patient Position: Sitting, Cuff Size: Normal)   Pulse 62   Ht '5\' 9"'$  (1.753 m)   Wt 161 lb (73 kg)   BMI 23.78 kg/m  , BMI Body mass index is 23.78 kg/m. GENERAL:  Well appearing HEENT:  Pupils equal round and reactive, fundi not visualized, oral mucosa unremarkable NECK:  No jugular venous distention, waveform within normal limits, carotid upstroke brisk and symmetric, no bruits, no thyromegaly LYMPHATICS:  No cervical, inguinal adenopathy LUNGS:  Clear to auscultation bilaterally BACK:  No CVA tenderness CHEST:  Unremarkable HEART:  PMI not displaced or sustained,S1 and S2 within normal limits, no S3, no S4, no clicks, no rubs, 2 out of 6  apical systolic murmur early peaking and no change with position or Valsalva radiating into the left carotid, no diastolic murmurs ABD:  Flat, positive bowel sounds normal in frequency in pitch, no bruits, no rebound, no guarding, no midline pulsatile mass, no hepatomegaly, no splenomegaly EXT:  2 plus pulses throughout, no edema, no cyanosis no clubbing SKIN:  No rashes no nodules NEURO:  Cranial nerves II through XII grossly intact, motor grossly intact throughout PSYCH:  Cognitively intact, oriented to person place and time    EKG:  EKG is ordered today. The ekg ordered today demonstrates sinus rhythm, rate 62, axis within normal limits, possible left atrial enlargement, RSR prime V1 V2, no acute ST-T wave changes.   Recent Labs: 02/14/2022: ALT 20; BUN 13; Creatinine, Ser 0.71; Hemoglobin 13.1; Platelets 211; Potassium 4.3; Sodium 133; TSH 3.060    Lipid Panel    Component Value Date/Time   CHOL 266 (H) 02/14/2022 0945   TRIG 54 02/14/2022 0945   HDL 130 02/14/2022 0945   LDLCALC 128 (H) 02/14/2022 0945      Wt Readings from Last 3 Encounters:  05/02/22 162 lb (73.5 kg)  05/02/22 161 lb (73 kg)  10/19/20 157 lb (71.2 kg)      Other studies Reviewed: Additional studies/ records that were reviewed today include: Extensive review of a large number of records from [facility] in the emergency room. Review of the above records demonstrates:  Please see elsewhere in the note.     ASSESSMENT AND PLAN:  Palpitations: These are well controlled with a low-dose beta-blocker.  No change in therapy.  Hypertension: She is only been taking her Norvasc as needed but she does not check her blood pressure frequently.  I told her to stay off the Norvasc until she gives [place] a couple of weeks of routine blood pressure readings and then we can decide whether she needs daily dosing.  Coronary calcium she has no symptoms.  This was noted incidentally a few years ago.  We did talk  extensively about risk reduction.  Dyslipidemia: Her LDL is elevated but she has a tremendous HDL.  No change in therapy.   Current medicines are reviewed at length with the patient today.  The patient does not have concerns regarding medicines.  The following changes have been made:  no change  Labs/ tests ordered today include:   Orders Placed This Encounter  Procedures   EKG 12-Lead     Disposition:   FU with me in one year    Signed, Minus Breeding, MD  05/02/2022 5:11 PM    [place]

## 2022-05-02 ENCOUNTER — Encounter: Payer: Self-pay | Admitting: Internal Medicine

## 2022-05-02 ENCOUNTER — Ambulatory Visit: Payer: Medicare Other | Admitting: Cardiology

## 2022-05-02 ENCOUNTER — Encounter: Payer: Self-pay | Admitting: Cardiology

## 2022-05-02 ENCOUNTER — Ambulatory Visit (INDEPENDENT_AMBULATORY_CARE_PROVIDER_SITE_OTHER): Payer: Medicare Other | Admitting: Internal Medicine

## 2022-05-02 VITALS — BP 138/74 | HR 62 | Ht 69.0 in | Wt 161.0 lb

## 2022-05-02 DIAGNOSIS — R918 Other nonspecific abnormal finding of lung field: Secondary | ICD-10-CM | POA: Diagnosis not present

## 2022-05-02 DIAGNOSIS — R002 Palpitations: Secondary | ICD-10-CM

## 2022-05-02 MED ORDER — FAMOTIDINE 20 MG PO TABS: ORAL_TABLET | ORAL | 11 refills | Status: AC

## 2022-05-02 MED ORDER — PANTOPRAZOLE SODIUM 40 MG PO TBEC: 40.0000 mg | DELAYED_RELEASE_TABLET | Freq: Every day | ORAL | 2 refills | Status: DC

## 2022-05-02 MED ORDER — ATENOLOL 25 MG PO TABS: 25.0000 mg | ORAL_TABLET | Freq: Every day | ORAL | 3 refills | Status: AC

## 2022-05-02 MED ORDER — MOMETASONE FURO-FORMOTEROL FUM 100-5 MCG/ACT IN AERO: INHALATION_SPRAY | RESPIRATORY_TRACT | 11 refills | Status: DC

## 2022-05-02 NOTE — Patient Instructions (Addendum)
Pantoprazole (protonix) 40 mg   Take  30-60 min before first meal of the day and Pepcid (famotidine)  20 mg after supper until return to office - this is the best way to tell whether stomach acid is contributing to your problem.    If doing great after six weeks ok to stop pantoprazole and replace with pepcid 20 mg after breakfast    Dulera 100 Take 1-2 puffs first thing in am and then another 2 puffs about 12 hours later.   For cough /congestion >>  mucinex dm up to 1200 mg every 12 hours as needed    Please schedule a follow up visit in 3 months but call sooner if needed

## 2022-05-02 NOTE — Patient Instructions (Signed)
  Follow-Up: At [facility], you and your health needs are our priority.  As part of our continuing mission to provide you with exceptional heart care, we have created designated Provider Care Teams.  These Care Teams include your primary Cardiologist (physician) and Advanced Practice Providers (APPs -  Physician Assistants and Nurse Practitioners) who all work together to provide you with the care you need, when you need it.  We recommend signing up for the patient portal called "MyChart".  Sign up information is provided on this After Visit Summary.  MyChart is used to connect with patients for Virtual Visits (Telemedicine).  Patients are able to view lab/test results, encounter notes, upcoming appointments, etc.  Non-urgent messages can be sent to your provider as well.   To learn more about what you can do with MyChart, go to [email or web address].    Your next appointment:   12 month(s)  The format for your next appointment:   In Person  Provider:   Minus Breeding MD    Other Instructions  Omion blood pressure cuff  Important Information About Sugar

## 2022-05-02 NOTE — Progress Notes (Unsigned)
Subjective:   Patient ID: [name], female    DOB: 05-Dec-1951   MRN: [id number]    Brief patient profile:  28  yowf  MM quit smoking 1979 s sequelae with sinus infections dating back to the 90's with fall> spring pnds s/p sinus surgery in 90's then sick p trip to [place] mid August 3 days after arrival with sinus pain bad cough rx by Ernesto Rutherford with nasty mucus (green) with abx cleared the mucus but worse cough dx as bronchitis > cxr > no acute changes > ct abn so referred by Dr Jackson Latino to pulmonary clinic 09/09/13 with dx of bronchiectasis 09/02/13 assoc with mpns    History of Present Illness  09/09/2013 1st [facility] office visit/ Sherrin Stahle  Chief Complaint  Patient presents with   Pulmonary Consult    Referred per Dr [name] for eval of abnormal ct chest. Pt c/o dyspnea and pain between shoulder blades x 6 wks.    cp only centered in back not pleuritic but rather positional  Feels heaviness in chest worse first thing in am  Cough is better but still present,  Mostly dry hack and urge to clear throat day > night rec Pantoprazole (protonix) 40 mg   Take 30-60 min before first meal of the day and Pepcid 20 mg one bedtime until return to office - this is the best way to tell whether stomach acid is contributing to your problem.   GERD diet   10/08/2013 f/u ov/Vasco Chong re: cough/ sob ? UACS Chief Complaint  Patient presents with   Followup with PFT    Back pain is unchanged since last visit. Cough has improved some. Her dyspnea is slightly improved.   although definitely better,  Does not feel any benefit from acid suppression or diet isues, thinks it's all weather/ season related. No more chest heaviness in ams rec Bronchiectasis =   you have scarring of your bronchial tubes  Ok to try off the acid suppression after 4 weeks of therapy to see what difference if any this makes and start back immediately if cough flares for any reason.   07/23/2014 f/u ov/Elbie Statzer re: recurrent cough, did  not activate action plan  Chief Complaint  Patient presents with   Acute Visit    Pt c/o trouble taking a deep breath in the morning and intermittnly throughout the day. C/o increase in work of breathing, dry cough and chest heaviness and mid upper back x 1 week.    onset 2 weeks abrupt sensation of palpitations "with skip beat" Then 1 week prior to OV  Sensation of not being able to take a deep breath rx with alb due to findings on pfts but not really much better and caused palp worse so not suing  No change ex tol/ no sob walking, no trouble sitting, sleeping   Problems with speaking with hoarseness, sob  Pain is midline has same pattern and characteristics as prev episodes  rec Pantoprazole (protonix) 40 mg   Take 30-60 min before first meal of the day and Pepcid 20 mg one bedtime until return to office   GERD diet  Best inhaler should you need one to use as needed is  xopenex 1-2 puffs every 4 hours and does not cause heart racing      10/19/2020  f/u ov/Neve Branscomb re: bronchiectasis s airflow obst / neg allergy profile 06/09/2019 Chief Complaint  Patient presents with   Follow-up    go over pft results  Dyspnea:  Back stops her before breathing / worse p eating or speakers Cough: better now /  Sleeping: bed is flat/ one pillow  SABA use: none  02: none  Rec Plan A = Automatic = Always=   Dulera 100 2 puffs every 12 hours for a full week then ok to use as needed  Plan B = Backup (to supplement plan A, not to replace it) Only use your albuterol inhaler as a rescue medication Try albuterol 15 min before an activity that you know would make you short of breath     If not improving after a few days, then restart protonix 40 mg Take 30- 60 min before your first and last meals of the day  Make sure you check your oxygen saturation at your highest level of activity to be sure it stays over 90%     05/02/2022  f/u ov/Maryuri Warnke re: bronchiectasis maint on otc antihistamines   Chief Complaint   Patient presents with   Follow-up  Dyspnea:  worse off dulera  100  Cough: clear mucus Sleeping: fine / one pillow  SABA use: none  02: none  Covid status:   none / infected x 3    No obvious day to day or daytime variability or assoc excess/ purulent sputum or mucus plugs or hemoptysis or cp or chest tightness, subjective wheeze or overt sinus or hb symptoms.   *** without nocturnal  or early am exacerbation  of respiratory  c/o's or need for noct saba. Also denies any obvious fluctuation of symptoms with weather or environmental changes or other aggravating or alleviating factors except as outlined above   No unusual exposure hx or h/o childhood pna/ asthma or knowledge of premature birth.  Current Allergies, Complete Past Medical History, Past Surgical History, Family History, and Social History were reviewed in [employer] record.  ROS  The following are not active complaints unless bolded Hoarseness, sore throat, dysphagia, dental problems, itching, sneezing,  nasal congestion or discharge of excess mucus or purulent secretions, ear ache,   fever, chills, sweats, unintended wt loss or wt gain, classically pleuritic or exertional cp,  orthopnea pnd or arm/hand swelling  or leg swelling, presyncope, palpitations, abdominal pain, anorexia, nausea, vomiting, diarrhea  or change in bowel habits or change in bladder habits, change in stools or change in urine, dysuria, hematuria,  rash, arthralgias, visual complaints, headache, numbness, weakness or ataxia or problems with walking or coordination,  change in mood or  memory.        Current Meds  Medication Sig   albuterol (VENTOLIN HFA) 108 (90 Base) MCG/ACT inhaler    ALPRAZolam (XANAX) 0.5 MG tablet Take 0.5 mg by mouth 3 (three) times daily as needed.    Ascorbic Acid (VITAMIN C) 1000 MG tablet Take 1,000 mg by mouth daily.   atenolol (TENORMIN) 25 MG tablet Take 1 tablet (25 mg total) by mouth daily.    Bioflavonoid Products (QUERCETIN COMPLEX IMMUNE) CAPS Take 1 capsule by mouth. Takes 1,'000mg'$  every other day   butalbital-acetaminophen-caffeine (FIORICET, ESGIC) 50-325-40 MG per tablet Take 1 tablet by mouth every 4 (four) hours as needed for headache or migraine.   Cholecalciferol (CVS VIT D 5000 HIGH-POTENCY PO) Take by mouth.   Estradiol 1 MG/GM GEL Place 1 application onto the skin every morning.   famotidine (PEPCID) 20 MG tablet Take 20 mg by mouth daily.   FLOVENT HFA 220 MCG/ACT inhaler Inhale 2 puffs into the lungs 2 (two)  times daily.   NONFORMULARY OR COMPOUNDED ITEM Apply 1 application topically at bedtime. Progesterone   therapeutic multivitamin-minerals (THERAGRAN-M) tablet Take 1 tablet by mouth daily.   traMADol (ULTRAM) 50 MG tablet TAKE 1 TABLET BY MOUTH EVERY 6 HOURS AS NEEDED FOR SEVERE PAIN( MAX OF 20 TABS PER MONTH)   traZODone (DESYREL) 50 MG tablet Patient takes 0.5 tablet daily   UNABLE TO FIND Med Name: Omega 3   zinc gluconate 50 MG tablet Take 50 mg by mouth daily.             Objective:   Physical Exam   Wt  05/02/2022         ***  10/19/2020        157  12/09/2019      161  07/23/2014        160  > 03/10/2016   161 >  11/13/2018  162     10/08/13 160 lb (72.576 kg)  09/09/13 162 lb 12.8 oz (73.846 kg)  04/14/13 161 lb (73.029 kg)     Vital signs reviewed  05/02/2022  - Note at rest 02 sats  ***% on ***   General appearance:    ***           Assessment & Plan:

## 2022-05-03 ENCOUNTER — Encounter: Payer: Self-pay | Admitting: Internal Medicine

## 2022-05-03 NOTE — Assessment & Plan Note (Addendum)
DX by  CT chest 2006 and 09/02/13 1. No acute cardiopulmonary abnormalities.  2. Spectrum of findings within the lungs, including peripheral  tree-in-bud nodularity and bronchiectasis, favor sequelae of chronic  indolent/atypical infection. 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  3. Small pericardial effusion PFT 10/08/13 wnl  - CT abd 02/29/16 Lower chest: In the inferior aspect of the lingula and anterior left lower lobe, there are multiple small indeterminate pulmonary nodules. Largest has mean diameter of 6 mm on image 7 of series 5. These are new since 2010 exam -CT chest  repeat 01/12/16 Multiple bilateral pulmonary nodules. When compared to chest CT 09/02/2013, there is a new nodule within the right middle lobe, left upper lobe and superior segment right lower lobe. The largest within the right lower lobe has a mean dimension of 9 mm. Non-contrast chest CT at 3-6 months is recommended> rec 6  m study in reminder file > did not do  - repeat CT  09/09/2018 >>> 1. 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 Findings overall mildly worsened since 01/11/2017 chest CT. 2. Stable chronic small pericardial effusion/thickening. 3. One vessel coronary atherosclerosis  06/09/2019  After extensive coaching inhaler device,  effectiveness =    90% > start symb 80 2bid 06/09/2019  Alpha one AT  MM 06/09/2019  Quant Ig's  wnl PFT's  10/19/2020  FEV1 2.22 (77 % ) ratio 0.77  p 2 % improvement from saba p 0 prior to study with DLCO  23.39 (100%) corrects to 5.15 (126%)  for alv volume and FV curve nl  Despite nl baseline pfts feels doe worse off low dose ics/laba so reasonable to re-start dulera 100 dosed 1-2 q 12h to promote  better MC clearance and continue mucinex dm as well  She does have assoc GERD symptoms which risk making bronchiectasis worse and cough makes gerd worse so reasonable to control acutely with ppi .  However, Discussed the recent press about ppi's in the context of a statistically significant (but questionably clinically relevant) increase in CRI in pts on ppi vs h2's > bottom line is the lowest dose of ppi that controls   gerd is the right dose and if that dose is zero that's fine esp since h2's are cheaper.  see avs for instructions unique to this ov   F/u in 3 m, needs cxr on return for new baseline sp multiple bouts of covid 19/ continues to refuse vaccination "only really hurts unhealthy people and I've heard bad things about the vaccine"  made her aware bronchiectasis and age 71 does put her at risk.           Each maintenance medication was reviewed in detail including emphasizing most importantly the difference between maintenance and prns and under what circumstances the prns are to be triggered using an action plan format where appropriate.  Total time for H and P, chart review, counseling, reviewing hfa device(s) and generating customized AVS unique to this office visit / same day charting =  22 min

## 2022-05-16 ENCOUNTER — Telehealth: Payer: Self-pay | Admitting: Internal Medicine

## 2022-05-16 NOTE — Telephone Encounter (Signed)
Called and spoke with pt who states that she does not feel any better after last OV with MW. Pt said she is taking all meds as requested but states that nothing has helped.  Pt has had a dull headache all the time and has been having to use her fioricet more often than before. States that the fioricet does help relieve the headache but as soon as the fioricet wears off, the headache will come back.  Pt has had complaints of head congestion which is also in her throat and also has had hoarseness.  Pt wonders if her symptoms could be allergy related.   Pt wants to know what could be recommended to help with her symptoms. Dr. Melvyn Novas, please advise.     Patient Instructions by Tanda Rockers, MD at 05/02/2022 4:30 PM  Author: Tanda Rockers, MD Author Type: Physician Filed: 05/02/2022  5:29 PM  Note Status: Addendum Mickle Mallory: Cosign Not Required Encounter Date: 05/02/2022  Editor: Tanda Rockers, MD (Physician)      Prior Versions: 1. Tanda Rockers, MD (Physician) at 05/02/2022  5:27 PM - Signed    Pantoprazole (protonix) 40 mg   Take  30-60 min before first meal of the day and Pepcid (famotidine)  20 mg after supper until return to office - this is the best way to tell whether stomach acid is contributing to your problem.     If doing great after six weeks ok to stop pantoprazole and replace with pepcid 20 mg after breakfast      Dulera 100 Take 1-2 puffs first thing in am and then another 2 puffs about 12 hours later.    For cough /congestion >>  mucinex dm up to 1200 mg every 12 hours as needed      Please schedule a follow up visit in 3 months but call sooner if needed

## 2022-05-16 NOTE — Telephone Encounter (Signed)
If the main problem is nasal congestions/ head ache I would suggest Mucinex D or allergra D prn  The last allergy screen was negative and the response to fiocet does not suggest at all an  allergy now   Best immediate approach if not improving on above is sinus Ct which we can probably get done this week  In meantime whoever prescribed the fiorcet for her maybe should see her next.

## 2022-05-16 NOTE — Telephone Encounter (Signed)
Called and spoke with patient. She stated that she forgot she had a televisit scheduled with her PCP after her call here. She explained everything to him and he ordered a abx and prednisone taper for her. She wishes to hold off on the CT scan for now but will call back if she is not feeling any better after starting the new medications.   Nothing further needed at time of call.

## 2022-08-01 ENCOUNTER — Ambulatory Visit: Payer: Medicare Other | Admitting: Internal Medicine

## 2022-08-30 ENCOUNTER — Ambulatory Visit: Payer: Medicare Other | Admitting: Internal Medicine

## 2022-08-30 NOTE — Progress Notes (Deleted)
Subjective:   Patient ID: [name], female    DOB: 05-Dec-1951   MRN: [id number]    Brief patient profile:  28  yowf  MM quit smoking 1979 s sequelae with sinus infections dating back to the 90's with fall> spring pnds s/p sinus surgery in 90's then sick p trip to [place] mid August 3 days after arrival with sinus pain bad cough rx by Ernesto Rutherford with nasty mucus (green) with abx cleared the mucus but worse cough dx as bronchitis > cxr > no acute changes > ct abn so referred by Dr Jackson Latino to pulmonary clinic 09/09/13 with dx of bronchiectasis 09/02/13 assoc with mpns    History of Present Illness  09/09/2013 1st [facility] office visit/ [name]  Chief Complaint  Patient presents with   Pulmonary Consult    Referred per Dr [name] for eval of abnormal ct chest. Pt c/o dyspnea and pain between shoulder blades x 6 wks.    cp only centered in back not pleuritic but rather positional  Feels heaviness in chest worse first thing in am  Cough is better but still present,  Mostly dry hack and urge to clear throat day > night rec Pantoprazole (protonix) 40 mg   Take 30-60 min before first meal of the day and Pepcid 20 mg one bedtime until return to office - this is the best way to tell whether stomach acid is contributing to your problem.   GERD diet   10/08/2013 f/u ov/Keefer Soulliere re: cough/ sob ? [facility]ACS Chief Complaint  Patient presents with   Followup with PFT    Back pain is unchanged since last visit. Cough has improved some. Her dyspnea is slightly improved.   although definitely better,  Does not feel any benefit from acid suppression or diet isues, thinks it's all weather/ season related. No more chest heaviness in ams rec Bronchiectasis =   you have scarring of your bronchial tubes  Ok to try off the acid suppression after 4 weeks of therapy to see what difference if any this makes and start back immediately if cough flares for any reason.   07/23/2014 f/u ov/[name] re: recurrent cough, did  not activate action plan  Chief Complaint  Patient presents with   Acute Visit    Pt c/o trouble taking a deep breath in the morning and intermittnly throughout the day. C/o increase in work of breathing, dry cough and chest heaviness and mid upper back x 1 week.    onset 2 weeks abrupt sensation of palpitations "with skip beat" Then 1 week prior to OV  Sensation of not being able to take a deep breath rx with alb due to findings on pfts but not really much better and caused palp worse so not suing  No change ex tol/ no sob walking, no trouble sitting, sleeping   Problems with speaking with hoarseness, sob  Pain is midline has same pattern and characteristics as prev episodes  rec Pantoprazole (protonix) 40 mg   Take 30-60 min before first meal of the day and Pepcid 20 mg one bedtime until return to office   GERD diet  Best inhaler should you need one to use as needed is  xopenex 1-2 puffs every 4 hours and does not cause heart racing      10/19/2020  f/u ov/[name] re: bronchiectasis s airflow obst / neg allergy profile 06/09/2019 Chief Complaint  Patient presents with   Follow-up    go over pft results  Dyspnea:  Back stops her before breathing / worse p eating or speakers Cough: better now /  Sleeping: bed is flat/ one pillow  SABA use: none  02: none  Rec Plan A = Automatic = Always=   Dulera 100 2 puffs every 12 hours for a full week then ok to use as needed  Plan B = Backup (to supplement plan A, not to replace it) Only use your albuterol inhaler as a rescue medication Try albuterol 15 min before an activity that you know would make you short of breath     If not improving after a few days, then restart protonix 40 mg Take 30- 60 min before your first and last meals of the day  Make sure you check your oxygen saturation at your highest level of activity to be sure it stays over 90%     05/02/2022  f/u ov/[name] re: bronchiectasis maint on otc antihistamines   Chief Complaint   Patient presents with   Follow-up  Dyspnea:  worse off dulera  100  Cough: clear mucus Sleeping: fine / one pillow  SABA use: none  02: none  Covid status:   none / infected x 3  "only really hurts unhealthy people and I've heard bad things about the vaccine"  made her aware bronchiectasis and age 45 does put her at risk.  Rec Pantoprazole (protonix) 40 mg   Take  30-60 min before first meal of the day and Pepcid (famotidine)  20 mg after supper   If doing great after six weeks ok to stop pantoprazole and replace with pepcid 20 mg after breakfast  Dulera 100 Take 1-2 puffs first thing in am and then another 2 puffs about 12 hours later.  For cough /congestion >>  mucinex dm up to 1200 mg every 12 hours as needed   08/30/2022  f/u ov/[name] re: ***   maint on ***  No chief complaint on file.   Dyspnea:  *** Cough: *** Sleeping: *** SABA use: *** 02: *** Covid status:   ***   No obvious day to day or daytime variability or assoc excess/ purulent sputum or mucus plugs or hemoptysis or cp or chest tightness, subjective wheeze or overt sinus or hb symptoms.   *** without nocturnal  or early am exacerbation  of respiratory  c/o's or need for noct saba. Also denies any obvious fluctuation of symptoms with weather or environmental changes or other aggravating or alleviating factors except as outlined above   No unusual exposure hx or h/o childhood pna/ asthma or knowledge of premature birth.  Current Allergies, Complete Past Medical History, Past Surgical History, Family History, and Social History were reviewed in [employer] record.  ROS  The following are not active complaints unless bolded Hoarseness, sore throat, dysphagia, dental problems, itching, sneezing,  nasal congestion or discharge of excess mucus or purulent secretions, ear ache,   fever, chills, sweats, unintended wt loss or wt gain, classically pleuritic or exertional cp,  orthopnea pnd or arm/hand  swelling  or leg swelling, presyncope, palpitations, abdominal pain, anorexia, nausea, vomiting, diarrhea  or change in bowel habits or change in bladder habits, change in stools or change in urine, dysuria, hematuria,  rash, arthralgias, visual complaints, headache, numbness, weakness or ataxia or problems with walking or coordination,  change in mood or  memory.        No outpatient medications have been marked as taking for the 08/30/22 encounter (Appointment) with Christinia Gully  B, MD.               Objective:   Physical Exam   Wt  08/30/2022         ***  05/02/2022        162  10/19/2020        157  12/09/2019      161  07/23/2014        160  > 03/10/2016   161 >  11/13/2018  162     10/08/13 160 lb (72.576 kg)  09/09/13 162 lb 12.8 oz (73.846 kg)  04/14/13 161 lb (73.029 kg)    Vital signs reviewed  08/30/2022  - Note at rest 02 sats  ***% on ***   General appearance:    ***  minimal insp/exp rhonchi bilaterally***           Assessment & Plan:

## 2022-12-22 ENCOUNTER — Other Ambulatory Visit: Payer: Self-pay | Admitting: Internal Medicine

## 2022-12-23 LAB — COMPLETE METABOLIC PANEL WITH GFR
AG Ratio: 1.9 (calc) (ref 1.0–2.5)
ALT: 28 U/L (ref 6–29)
AST: 26 U/L (ref 10–35)
Albumin: 4.4 g/dL (ref 3.6–5.1)
Alkaline phosphatase (APISO): 67 U/L (ref 37–153)
BUN: 18 mg/dL (ref 7–25)
CO2: 26 mmol/L (ref 20–32)
Calcium: 9.4 mg/dL (ref 8.6–10.4)
Chloride: 94 mmol/L — ABNORMAL LOW (ref 98–110)
Creat: 0.64 mg/dL (ref 0.50–1.05)
Globulin: 2.3 g/dL (calc) (ref 1.9–3.7)
Glucose, Bld: 84 mg/dL (ref 65–99)
Potassium: 3.8 mmol/L (ref 3.5–5.3)
Sodium: 133 mmol/L — ABNORMAL LOW (ref 135–146)
Total Bilirubin: 0.3 mg/dL (ref 0.2–1.2)
Total Protein: 6.7 g/dL (ref 6.1–8.1)
eGFR: 96 mL/min/{1.73_m2} (ref >=60)

## 2022-12-23 LAB — CBC
HCT: 37 % (ref 35.0–45.0)
Hemoglobin: 12.7 g/dL (ref 11.7–15.5)
MCH: 33.1 pg — ABNORMAL HIGH (ref 27.0–33.0)
MCHC: 34.3 g/dL (ref 32.0–36.0)
MCV: 96.4 fL (ref 80.0–100.0)
MPV: 10 fL (ref 7.5–12.5)
Platelets: 233 10*3/uL (ref 140–400)
RBC: 3.84 10*6/uL (ref 3.80–5.10)
RDW: 12.4 % (ref 11.0–15.0)
WBC: 8 10*3/uL (ref 3.8–10.8)

## 2022-12-23 LAB — LIPID PANEL
Cholesterol: 255 mg/dL — ABNORMAL HIGH (ref <200)
HDL: 126 mg/dL (ref >=50)
LDL Cholesterol (Calc): 108 mg/dL (calc) — ABNORMAL HIGH
Non-HDL Cholesterol (Calc): 129 mg/dL (calc) (ref <130)
Total CHOL/HDL Ratio: 2 (calc) (ref <5.0)
Triglycerides: 110 mg/dL (ref <150)

## 2022-12-23 LAB — TSH: TSH: 2.53 mIU/L (ref 0.40–4.50)

## 2022-12-23 LAB — VITAMIN D 25 HYDROXY (VIT D DEFICIENCY, FRACTURES): Vit D, 25-Hydroxy: 85 ng/mL (ref 30–100)

## 2023-05-21 ENCOUNTER — Other Ambulatory Visit: Payer: Self-pay | Admitting: Internal Medicine

## 2023-05-22 LAB — COMPLETE METABOLIC PANEL WITH GFR
AG Ratio: 1.8 (calc) (ref 1.0–2.5)
ALT: 25 U/L (ref 6–29)
AST: 22 U/L (ref 10–35)
Albumin: 4.2 g/dL (ref 3.6–5.1)
Alkaline phosphatase (APISO): 69 U/L (ref 37–153)
BUN: 11 mg/dL (ref 7–25)
CO2: 28 mmol/L (ref 20–32)
Calcium: 8.9 mg/dL (ref 8.6–10.4)
Chloride: 97 mmol/L — ABNORMAL LOW (ref 98–110)
Creat: 0.61 mg/dL (ref 0.50–1.05)
Globulin: 2.3 g/dL (calc) (ref 1.9–3.7)
Glucose, Bld: 92 mg/dL (ref 65–99)
Potassium: 4 mmol/L (ref 3.5–5.3)
Sodium: 133 mmol/L — ABNORMAL LOW (ref 135–146)
Total Bilirubin: 0.3 mg/dL (ref 0.2–1.2)
Total Protein: 6.5 g/dL (ref 6.1–8.1)
eGFR: 97 mL/min/{1.73_m2} (ref >=60)

## 2023-05-22 LAB — LIPID PANEL
Cholesterol: 254 mg/dL — ABNORMAL HIGH (ref <200)
HDL: 131 mg/dL (ref >=50)
LDL Cholesterol (Calc): 109 mg/dL (calc) — ABNORMAL HIGH
Non-HDL Cholesterol (Calc): 123 mg/dL (calc) (ref <130)
Total CHOL/HDL Ratio: 1.9 (calc) (ref <5.0)
Triglycerides: 48 mg/dL (ref <150)

## 2023-05-22 LAB — EXTRA LAV TOP TUBE

## 2023-06-11 DIAGNOSIS — R1310 Dysphagia, unspecified: Secondary | ICD-10-CM | POA: Insufficient documentation

## 2023-06-11 DIAGNOSIS — G43909 Migraine, unspecified, not intractable, without status migrainosus: Secondary | ICD-10-CM | POA: Insufficient documentation

## 2023-06-11 DIAGNOSIS — F419 Anxiety disorder, unspecified: Secondary | ICD-10-CM | POA: Insufficient documentation

## 2023-06-11 DIAGNOSIS — K589 Irritable bowel syndrome without diarrhea: Secondary | ICD-10-CM | POA: Insufficient documentation

## 2023-06-25 ENCOUNTER — Telehealth: Payer: Self-pay | Admitting: Cardiology

## 2023-06-25 NOTE — Telephone Encounter (Signed)
 Patient is returning call.  °

## 2023-06-25 NOTE — Telephone Encounter (Signed)
Pt c/o swelling: STAT is pt has developed SOB within 24 hours  If swelling, where is the swelling located? Feet   How much weight have you gained and in what time span? N/A  Have you gained 3 pounds in a day or 5 pounds in a week? N/A  Do you have a log of your daily weights (if so, list)? No   Are you currently taking a fluid pill? No   Are you currently SOB? No   Have you traveled recently? Yes

## 2023-06-25 NOTE — Telephone Encounter (Signed)
Left voicemail message for the patient to call the clinic. 

## 2023-06-25 NOTE — Telephone Encounter (Signed)
Patient state swelling to ankles and feet, comes and goes.  Went to [place] and resolved at her return. She was driving.  Sodium intake was fairly good but she may have had increased while she was gone. No SOB. BP 140/150 Range.  Takes Atenolol 1/2 tablet when needed. Does not want diuretics if at all possible Will be travelling again next week and is concerned with swelling through the trip.   Advised to watch sodium intake, low compression socks when driving.. Frequent breaks and elevate each evening when at home/hotel. Please advise if any other recommendations

## 2023-06-26 NOTE — Telephone Encounter (Signed)
Advised same recommendations and she will get some asap

## 2023-08-12 DIAGNOSIS — R931 Abnormal findings on diagnostic imaging of heart and coronary circulation: Secondary | ICD-10-CM | POA: Insufficient documentation

## 2023-08-12 DIAGNOSIS — I10 Essential (primary) hypertension: Secondary | ICD-10-CM | POA: Insufficient documentation

## 2023-08-12 NOTE — Progress Notes (Deleted)
Cardiology Office Note:   Date:  08/12/2023  ID:  Gabrielle, Bender 1951-02-04, MRN [id number] PCP: Fleet Contras, MD  [facility] Providers Cardiologist:  None {  History of Present Illness:   [name] is a 72 y.o. female who presents for evaluation of palpitations.  She has previously seen Dr. Patty Sermons before he retired.  She was seeing another cardiologist in [facility] but was not satisfied.  I was able to review extensive notes.  She had palpitations.  These were managed with a low-dose of beta-blocker.  She mostly felt this at night when she would lie on her left side.  She seems to have done well with atenolol over time.  She also has a heart murmur.  There was echocardiography dating back to 2012 and I see one from 2021 that demonstrated aortic sclerosis.  There were no other significant valvular abnormalities and she had a well-preserved ejection fraction.  I do also note that she has had coronary calcium noted on CT.  I see 1 from 2019.  ***  ***She does not however describe chest discomfort.  She walks about 3 times per week 30 minutes.  She is trying to work up a little bit longer.  She does not describe chest discomfort, neck or arm discomfort.  She is limited by some knee pain and so she has been able to get herself as much as she would like.  However, she has not she does not think limited by breathing or discomfort.  She does not describe PND or orthopnea.  She was in the emergency room in [facility] in November.  I reviewed these records for this appointment.  She had hypertensive urgency.  She had shortness of breath.  She had chest discomfort.  She reports her blood pressure was 220/110.  There were no acute EKG changes.  Enzymes were negative.  Chest x-ray was unremarkable except for some clustered micro nodularity in the right midlung.    ROS: ***  Studies Reviewed:    EKG:       ***  Risk Assessment/Calculations:   {Does this patient have ATRIAL  FIBRILLATION?:[phone]} No BP recorded.  {Refresh Note OR Click here to enter BP  :1}***        Physical Exam:   VS:  There were no vitals taken for this visit.   Wt Readings from Last 3 Encounters:  05/02/22 162 lb (73.5 kg)  05/02/22 161 lb (73 kg)  10/19/20 157 lb (71.2 kg)     GEN: Well nourished, well developed in no acute distress NECK: No JVD; No carotid bruits CARDIAC: ***RRR, no murmurs, rubs, gallops RESPIRATORY:  Clear to auscultation without rales, wheezing or rhonchi  ABDOMEN: Soft, non-tender, non-distended EXTREMITIES:  No edema; No deformity   ASSESSMENT AND PLAN:   Palpitations: ***  These are well controlled with a low-dose beta-blocker.  No change in therapy.   Hypertension:   ***  She is only been taking her Norvasc as needed but she does not check her blood pressure frequently.  I told her to stay off the Norvasc until she gives [place] a couple of weeks of routine blood pressure readings and then we can decide whether she needs daily dosing.   Coronary calcium :  ***  she has no symptoms.  This was noted incidentally a few years ago.  We did talk extensively about risk reduction.   Dyslipidemia: Her LDL is ***  elevated but she has a tremendous  HDL.  No change in therapy.      {Are you ordering a CV Procedure (e.g. stress test, cath, DCCV, TEE, etc)?   Press F2        :[id number]}  Follow up ***  Signed, Rollene Rotunda, MD

## 2023-08-15 ENCOUNTER — Ambulatory Visit: Payer: Medicare HMO | Admitting: Cardiology

## 2023-08-15 DIAGNOSIS — E785 Hyperlipidemia, unspecified: Secondary | ICD-10-CM

## 2023-08-15 DIAGNOSIS — R931 Abnormal findings on diagnostic imaging of heart and coronary circulation: Secondary | ICD-10-CM

## 2023-08-15 DIAGNOSIS — R002 Palpitations: Secondary | ICD-10-CM

## 2023-08-15 DIAGNOSIS — I10 Essential (primary) hypertension: Secondary | ICD-10-CM

## 2023-08-22 NOTE — Progress Notes (Unsigned)
Cardiology Office Note:   Date:  08/23/2023  ID:  [name], [name] Apr 13, 1951, MRN [id number] PCP: Fleet Contras, MD  [facility] Providers Cardiologist:  Rollene Rotunda, MD {  History of Present Illness:   [name] is a 72 y.o. female who presents for evaluation of palpitations.  She has previously seen Dr. Patty Sermons before he retired.  She was seeing another cardiologist in [facility] but was not satisfied.  I was able to review extensive notes.  She had palpitations.  These were managed with a low-dose of beta-blocker.  She mostly felt this at night when she would lie on her left side.  She seems to have done well with atenolol over time.  She also has a heart murmur.  There was echocardiography dating back to 2012 and I see one from 2021 that demonstrated aortic sclerosis.  There were no other significant valvular abnormalities and she had a well-preserved ejection fraction.  I do also note that she has had coronary calcium noted on CT.  I see one from 2019.  She had an episode while driving to [facility] recently where she threw up.  She went to emergency room and I was able to review some records.  She had a slightly low sodium.  She does drink 120 ounces of water a day.  She was treated for nausea.  She is also been managed for some vertigo.  She says her blood pressure has been fluctuating at home but I did review her blood pressure diary and has actually been in the 140s to 170s with diastolics in the 70s at home.  She has had a little bit of lower extremity swelling.  She has not had any presyncope or syncope.  She has not had any chest pressure, neck or arm discomfort.  She walks routinely for exercise and has no symptoms related to this.  She is in need of knee surgery but has not scheduled this yet.  ROS: As stated in the HPI and negative for all other systems.  Studies Reviewed:    EKG:   EKG Interpretation Date/Time:  Thursday August 23 2023 14:56:24  EDT Ventricular Rate:  70 PR Interval:  168 QRS Duration:  98 QT Interval:  404 QTC Calculation: 436 R Axis:   72  Text Interpretation: Normal sinus rhythm Normal ECG When compared with ECG of 03-Apr-2012 10:03, No significant change was found Confirmed by Rollene Rotunda ([id number]) on 08/23/2023 3:32:56 PM     Risk Assessment/Calculations:      Physical Exam:   VS:  BP 132/68 (BP Location: Left Arm, Patient Position: Sitting, Cuff Size: Normal)   Pulse 72   Ht 5' 9.5" (1.765 m)   Wt 155 lb (70.3 kg)   SpO2 91%   BMI 22.56 kg/m    Wt Readings from Last 3 Encounters:  08/23/23 155 lb (70.3 kg)  05/02/22 162 lb (73.5 kg)  05/02/22 161 lb (73 kg)     GEN: Well nourished, well developed in no acute distress NECK: No JVD; No carotid bruits CARDIAC: RRR, 2 out of 6 systolic murmur radiating at the aortic outflow tract, no diastolic murmurs, rubs, gallops RESPIRATORY:  Clear to auscultation without rales, wheezing or rhonchi  ABDOMEN: Soft, non-tender, non-distended EXTREMITIES:  No edema; No deformity   ASSESSMENT AND PLAN:    Hypertension:   I have suggested that she restart amlodipine which she was taking in the past.  This will be at 2.5 mg and she  can keep a blood pressure diary.   Coronary calcium : She has no symptoms related to this.  We talked about risk reduction.  She would not want to take a statin.  Her HDL is extraordinarily good.  LDL is not bad.  We talked about diet.   Preop: The patient has no high risk findings.  She has a very high functional level.  She is not going for high risk surgery.  Therefore, according to ACC/AHA guidelines she would be at acceptable risk for the planned procedure.  Murmur: I suspect some aortic stenosis mildly and we will check an echocardiogram.       Follow up with me in one year.   Signed, Rollene Rotunda, MD

## 2023-08-23 ENCOUNTER — Ambulatory Visit: Payer: Medicare HMO | Attending: Cardiology | Admitting: Cardiology

## 2023-08-23 ENCOUNTER — Encounter: Payer: Self-pay | Admitting: Cardiology

## 2023-08-23 VITALS — BP 132/68 | HR 72 | Ht 69.5 in | Wt 155.0 lb

## 2023-08-23 DIAGNOSIS — R931 Abnormal findings on diagnostic imaging of heart and coronary circulation: Secondary | ICD-10-CM

## 2023-08-23 DIAGNOSIS — I10 Essential (primary) hypertension: Secondary | ICD-10-CM

## 2023-08-23 DIAGNOSIS — E785 Hyperlipidemia, unspecified: Secondary | ICD-10-CM | POA: Diagnosis not present

## 2023-08-23 DIAGNOSIS — R002 Palpitations: Secondary | ICD-10-CM | POA: Diagnosis not present

## 2023-08-23 DIAGNOSIS — R011 Cardiac murmur, unspecified: Secondary | ICD-10-CM

## 2023-08-23 MED ORDER — AMLODIPINE BESYLATE 2.5 MG PO TABS: 2.5000 mg | ORAL_TABLET | Freq: Every day | ORAL | 3 refills | Status: DC

## 2023-08-23 NOTE — Patient Instructions (Addendum)
Medication Instructions:   Start taking Amlodipine 2.5 mg one tablet daily   *If you need a refill on your cardiac medications before your next appointment, please call your pharmacy*   Lab Work:     Not needed   Testing/Procedures:   Will be schedule at [facility] [address] Your physician has requested that you have an echocardiogram. Echocardiography is a painless test that uses sound waves to create images of your heart. It provides your doctor with information about the size and shape of your heart and how well your heart's chambers and valves are working. This procedure takes approximately one hour. There are no restrictions for this procedure. Please do NOT wear cologne, perfume, aftershave, or lotions (deodorant is allowed). Please arrive 15 minutes prior to your appointment time.   Follow-Up: At [facility], you and your health needs are our priority.  As part of our continuing mission to provide you with exceptional heart care, we have created designated Provider Care Teams.  These Care Teams include your primary Cardiologist (physician) and Advanced Practice Providers (APPs -  Physician Assistants and Nurse Practitioners) who all work together to provide you with the care you need, when you need it.  We recommend signing up for the patient portal called "MyChart".  Sign up information is provided on this After Visit Summary.  MyChart is used to connect with patients for Virtual Visits (Telemedicine).  Patients are able to view lab/test results, encounter notes, upcoming appointments, etc.  Non-urgent messages can be sent to your provider as well.   To learn more about what you can do with MyChart, go to [email or web address].    Your next appointment:   12 month(s)  The format for your next appointment:   In Person  Provider:   Rollene Rotunda, MD    Other Instructions   Keep blood pressure diary

## 2023-08-27 DIAGNOSIS — H81399 Other peripheral vertigo, unspecified ear: Secondary | ICD-10-CM | POA: Insufficient documentation

## 2023-09-25 ENCOUNTER — Ambulatory Visit: Payer: Medicare HMO | Attending: Cardiology

## 2023-09-25 DIAGNOSIS — R002 Palpitations: Secondary | ICD-10-CM

## 2023-09-25 DIAGNOSIS — R011 Cardiac murmur, unspecified: Secondary | ICD-10-CM | POA: Diagnosis not present

## 2023-09-25 LAB — ECHOCARDIOGRAM COMPLETE
AR max vel: 1.76 cm2
AV Area VTI: 1.75 cm2
AV Area mean vel: 1.68 cm2
AV Mean grad: 9.3 mm[Hg]
AV Peak grad: 17.4 mm[Hg]
Ao pk vel: 2.08 m/s
S' Lateral: 2.4 cm

## 2023-10-02 ENCOUNTER — Encounter: Payer: Self-pay | Admitting: *Deleted

## 2023-10-04 ENCOUNTER — Telehealth: Payer: Self-pay | Admitting: Cardiology

## 2023-10-04 NOTE — Telephone Encounter (Signed)
Patient is calling to have a nurse go over her Echo results. Patient requested we leave a detailed voicemail because she has a blocker on her phone.

## 2023-10-04 NOTE — Telephone Encounter (Signed)
Spoke to patient regarding her ECHO results. Message relayed and no questions at this time.

## 2023-11-21 ENCOUNTER — Other Ambulatory Visit: Payer: Self-pay

## 2023-11-21 ENCOUNTER — Telehealth: Payer: Self-pay | Admitting: Internal Medicine

## 2023-11-21 DIAGNOSIS — K5909 Other constipation: Secondary | ICD-10-CM | POA: Insufficient documentation

## 2023-11-21 NOTE — Telephone Encounter (Signed)
PT is calling and has double pneumonia and hyper inflated lungs. She is at home currently and would like to be worked in if possible.  She would like to speak to Dr. Sherene Sires, she said, and is willing to come in to [place] office. The questions she has for Dr. Is about the hyper inflated lungs. [phone]

## 2023-11-21 NOTE — Telephone Encounter (Signed)
Spoke with patient at length. Reassured her the treatment plan she was given at [facility] is great. She denies shob/fevers/chills or other symptoms, does have cough; reports she is feeling much better already. Advised patient unless she feels she is not improving she can follow up with PCP for repeat CXR, she believes she already has an appt scheduled for next week. She would like to schedule follow up with Sherene Sires, appointment held on 12/17/22 @ 3:15pm. She would like to know if MW has any openings in [place] sooner, advised this is unlikely but the front office could check for her. Patient advised she can also schedule with an APP if she would like.  Please check MWs schedule for any possible openings. Thanks!

## 2023-11-24 NOTE — Telephone Encounter (Signed)
Patient called in again with regards to her double pneumonia.  She feels that her symptoms have been slow to resolve.  She states that she has had no changes in sputum consistency, color, or quantity.  She is primarily nonproductive with an ongoing cough.  She has to cough syrups, benzoate Perles, and over-the-counter homemade tincture of elder berries that she feels are all similarly effective in controlling her cough.  She is unclear about the typical recovery from a pneumonia and states that her goal is to be feeling better before her grandchild is born in January.  She has significant anxiety but her symptoms seem to be clinically improving.  For now, no additional intervention is indicated.  To continue with anticipated appointment upcoming week.

## 2023-11-26 ENCOUNTER — Telehealth: Payer: Self-pay | Admitting: Internal Medicine

## 2023-11-26 NOTE — Telephone Encounter (Signed)
Can't think of anything else for now other than tincture of time but happy to work in to my schedule in [place] if really needs to be seen and no one can get her in there  in [place]

## 2023-11-26 NOTE — Telephone Encounter (Signed)
Primary Pulmonologist: Wert  Last office visit and with whom: 05/02/2022 Wert  What do we see them for (pulmonary problems): multiple pulmonary nodules and bronchiectasis Last OV assessment/plan:          Assessment & Plan Note by Nyoka Cowden, MD at 05/03/2022 4:40 AM  Author: Nyoka Cowden, MD Author Type: Physician Filed: 05/03/2022  4:45 AM  Note Status: Carlisle Cater: Cosign Not Required Encounter Date: 05/02/2022  Problem: Multiple pulmonary nodules assoc with bronchiectasis  Editor: Nyoka Cowden, MD (Physician)      Prior Versions: 1. Nyoka Cowden, MD (Physician) at 05/03/2022  4:43 AM - Written  DX by  CT chest 2006 and 09/02/13 1. No acute cardiopulmonary abnormalities.  2. Spectrum of findings within the lungs, including peripheral  tree-in-bud nodularity and bronchiectasis, favor sequelae of chronic  indolent/atypical infection. 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  3. Small pericardial effusion PFT 10/08/13 wnl  - CT abd 02/29/16 Lower chest: In the inferior aspect of the lingula and anterior left lower lobe, there are multiple small indeterminate pulmonary nodules. Largest has mean diameter of 6 mm on image 7 of series 5. These are new since 2010 exam -CT chest  repeat 01/12/16 Multiple bilateral pulmonary nodules. When compared to chest CT 09/02/2013, there is a new nodule within the right middle lobe, left upper lobe and superior segment right lower lobe. The largest within the right lower lobe has a mean dimension of 9 mm. Non-contrast chest CT at 3-6 months is recommended> rec 6  m study in reminder file > did not do  - repeat CT  09/09/2018 >>> 1. Spectrum of findings most compatible with chronic infectious bronchiolitis due to atypical mycobacterial infection (MAI), with mild cylindrical and varicoid bronchiectasis, bronchial wall thickening, scattered mucoid impaction, patchy tree-in-bud opacities and peripheral centrilobular  nodularity. Findings overall mildly worsened since 01/11/2017 chest CT. 2. Stable chronic small pericardial effusion/thickening. 3. One vessel coronary atherosclerosis  06/09/2019  After extensive coaching inhaler device,  effectiveness =    90% > start symb 80 2bid 06/09/2019  Alpha one AT  MM 06/09/2019  Quant Ig's  wnl PFT's  10/19/2020  FEV1 2.22 (77 % ) ratio 0.77  p 2 % improvement from saba p 0 prior to study with DLCO  23.39 (100%) corrects to 5.15 (126%)  for alv volume and FV curve nl   Despite nl baseline pfts feels doe worse off low dose ics/laba so reasonable to re-start dulera 100 dosed 1-2 q 12h to promote better MC clearance and continue mucinex dm as well   She does have assoc GERD symptoms which risk making bronchiectasis worse and cough makes gerd worse so reasonable to control acutely with ppi .  However, Discussed the recent press about ppi's in the context of a statistically significant (but questionably clinically relevant) increase in CRI in pts on ppi vs h2's > bottom line is the lowest dose of ppi that controls   gerd is the right dose and if that dose is zero that's fine esp since h2's are cheaper.  see avs for instructions unique to this ov     F/u in 3 m, needs cxr on return for new baseline sp multiple bouts of covid 19/ continues to refuse vaccination "only really hurts unhealthy people and I've heard bad things about the vaccine"  made her aware bronchiectasis and age 72 does put her at risk.  Each maintenance medication was reviewed in detail including emphasizing most importantly the difference between maintenance and prns and under what circumstances the prns are to be triggered using an action plan format where appropriate.   Total time for H and P, chart review, counseling, reviewing hfa device(s) and generating customized AVS unique to this office visit / same day charting =  22 min                  Patient Instructions by Nyoka Cowden,  MD at 05/02/2022 4:30 PM  Author: Nyoka Cowden, MD Author Type: Physician Filed: 05/02/2022  5:29 PM  Note Status: Addendum Cosign: Cosign Not Required Encounter Date: 05/02/2022  Editor: Nyoka Cowden, MD (Physician)      Prior Versions: 1. Nyoka Cowden, MD (Physician) at 05/02/2022  5:27 PM - Signed  Pantoprazole (protonix) 40 mg   Take  30-60 min before first meal of the day and Pepcid (famotidine)  20 mg after supper until return to office - this is the best way to tell whether stomach acid is contributing to your problem.     If doing great after six weeks ok to stop pantoprazole and replace with pepcid 20 mg after breakfast      Dulera 100 Take 1-2 puffs first thing in am and then another 2 puffs about 12 hours later.    For cough /congestion >>  mucinex dm up to 1200 mg every 12 hours as needed      Please schedule a follow up visit in 3 months but call sooner if needed          Was appointment offered to patient (explain)?  Offered, she declined   Reason for call: Called and spoke with patient, she is still not feeling well despite taking cough medicine and doxycycline. She is some better, but she does not feel well and has no energy.  She finishes up the doxycycline today.  She wants to know if she should be feeling well by now.  Denies any fever or chills.  Does have some body aches.  She has a cough from time to time, but is taking the cough medication prescribed and delsym.    Dr. Sherene Sires, please advise.  Thank you.  (examples of things to ask: : When did symptoms start? Fever? Cough? Productive? Color to sputum? More sputum than usual? Wheezing? Have you needed increased oxygen? Are you taking your respiratory medications? What over the counter measures have you tried?)  Allergies  Allergen Reactions   Sulfamethoxazole-Trimethoprim Hives   Diflucan [Fluconazole] Swelling     There is no immunization history on file for this patient.

## 2023-11-27 NOTE — Telephone Encounter (Signed)
I called and spoke with the pt  She is aware of MW's respons  I offered ov with MW today in [place] and she refused  She is seeing her PCP 12/20 and will discuss further with them  Note that she did state she was feeling better today  Will keep appt with MW for Jan 2025  Nothing further needed

## 2023-12-06 ENCOUNTER — Telehealth: Payer: Self-pay | Admitting: Internal Medicine

## 2023-12-06 NOTE — Telephone Encounter (Signed)
[facility] did an x-ray on the patient and the patient would like to know Dr.Wert's opinion about it. Patient was notified that Dr.Wert is currently out of office. Call back number [phone].

## 2023-12-07 NOTE — Telephone Encounter (Signed)
Pt is leaving Jan 9th and

## 2023-12-10 NOTE — Telephone Encounter (Signed)
Can't help her over the phone, willing to work her in to alternate spot if she'll bring all active meds  but nothing to offer based on her cxr - ok to add on to today if she can make it

## 2023-12-10 NOTE — Telephone Encounter (Signed)
Pt is wanted to know her results . Pt 's results are in care everywhere for 11/30/23 at [facility]

## 2023-12-10 NOTE — Telephone Encounter (Signed)
Can't full up the actual cxr but report doesn't appear worrisome - we can talk about this more at next week's ov

## 2023-12-10 NOTE — Telephone Encounter (Signed)
Called and spoke with patient informed her of her results Per Dr wert  she was concern that she will not be will for he trip on 9th, pt stated that she still having some coughing with clear mucus , some hoariness at times .pt is still taking otc mucuix to help with coughing.

## 2023-12-16 NOTE — Progress Notes (Signed)
 Subjective:   Patient ID: [name], female    DOB: [birth year]   MRN: [id number]    Brief patient profile:  72 yowf  MM quit smoking 1979 s sequelae with sinus infections dating back to the 90's with fall> spring pnds s/p sinus surgery in 90's then sick p trip to [place] mid August 3 days after arrival with sinus pain bad cough rx by Floy with nasty mucus (green) with abx cleared the mucus but worse cough dx as bronchitis > cxr > no acute changes > ct abn so referred by [name] to [facility] 09/09/13 with dx of bronchiectasis 09/02/13 assoc with mpns    History of Present Illness  09/09/2013 1st [name] office visit/ [name]  Chief Complaint  Patient presents with   Bender Consult    Referred per [name] for eval of abnormal ct chest. Pt c/o dyspnea and pain between shoulder blades x 6 wks.    cp only centered in back not pleuritic but rather positional  Feels heaviness in chest worse first thing in am  Cough is better but still present,  Mostly dry hack and urge to clear throat day > night rec Pantoprazole  (protonix ) 40 mg   Take 30-60 min before first meal of the day and Pepcid  20 mg one bedtime until return to office - this is the best way to tell whether stomach acid is contributing to your problem.   GERD diet   10/08/2013 f/u ov/[name] re: cough/ sob ? UACS Chief Complaint  Patient presents with   Followup with PFT    Back pain is unchanged since last visit. Cough has improved some. Her dyspnea is slightly improved.   although definitely better,  Does not feel any benefit from acid suppression or diet isues, thinks it's all weather/ season related. No more chest heaviness in ams rec Bronchiectasis =   you have scarring of your bronchial tubes  Ok to try off the acid suppression after 4 weeks of therapy to see what difference if any this makes and start back immediately if cough flares for any reason.   07/23/2014 f/u ov/[name] re: recurrent cough, did  not activate action plan  Chief Complaint  Patient presents with   Acute Visit    Pt c/o trouble taking a deep breath in the morning and intermittnly throughout the day. C/o increase in work of breathing, dry cough and chest heaviness and mid upper back x 1 week.    onset 2 weeks abrupt sensation of palpitations with skip beat Then 1 week prior to OV  Sensation of not being able to take a deep breath rx with alb due to findings on pfts but not really much better and caused palp worse so not suing  No change ex tol/ no sob walking, no trouble sitting, sleeping   Problems with speaking with hoarseness, sob  Pain is midline has same pattern and characteristics as prev episodes  rec Pantoprazole  (protonix ) 40 mg   Take 30-60 min before first meal of the day and Pepcid  20 mg one bedtime until return to office   GERD diet  Best inhaler should you need one to use as needed is  xopenex  1-2 puffs every 4 hours and does not cause heart racing      10/19/2020  f/u ov/[name] re: bronchiectasis s airflow obst / neg allergy  profile 06/09/2019 Chief Complaint  Patient presents with   Follow-up    go over pft results  Dyspnea:  Back stops her before breathing / worse p eating or speakers Cough: better now /  Sleeping: bed is flat/ one pillow  SABA use: none  02: none  Rec Plan A = Automatic = Always=   Dulera 100 2 puffs every 12 hours for a full week then ok to use as needed  Plan B = Backup (to supplement plan A, not to replace it) Only use your albuterol  inhaler as a rescue medication Try albuterol  15 min before an activity that you know would make you short of breath     If not improving after a few days, then restart protonix  40 mg Take 30- 60 min before your first and last meals of the day  Make sure you check your oxygen  saturation at your highest level of activity to be sure it stays over 90%     05/02/2022  f/u ov/[name] re: bronchiectasis maint on otc antihistamines   Chief Complaint   Patient presents with   Follow-up  Dyspnea:  worse off dulera  100  Cough: clear mucus Sleeping: fine / one pillow  SABA use: none  02: none  Covid status:   none / infected x 3  only really hurts unhealthy people and I've heard bad things about the vaccine  made her aware bronchiectasis and age 55 does put her at risk.  Rec Pantoprazole  (protonix ) 40 mg   Take  30-60 min before first meal of the day and Pepcid  (famotidine )  20 mg after supper until return to office  If doing great after six weeks ok to stop pantoprazole  and replace with pepcid  20 mg after breakfast  Dulera 100 Take 1-2 puffs first thing in am and then another 2 puffs about 12 hours later.  For cough /congestion >>  mucinex dm up to 1200 mg every 12 hours as needed    Please schedule a follow up visit in 3 months but call sooner if needed        12/18/2023  ACUTE  ov/[name] office/[name] re: bronchiectasis / sick since early Dec with doble pna (no cxrays available) with pesistent chest congestion  Chief Complaint  Patient presents with   Cough   Acute Visit  Dyspnea:  minimal doe  Cough: mostly clear mucus esp in am assoc with overt HB not on gerd rx as advised Sleeping: better  now s   resp cc lying flat in bed SABA use: minimal / no neb use needed  02: none     No obvious day to day or daytime variability or assoc  purulent sputum or mucus plugs or hemoptysis or cp or chest tightness, subjective wheeze or overt sinus  symptoms.    Also denies any obvious fluctuation of symptoms with weather or environmental changes or other aggravating or alleviating factors except as outlined above   No unusual exposure hx or h/o childhood pna/ asthma or knowledge of premature birth.  Current Allergies, Complete Past Medical History, Past Surgical History, Family History, and Social History were reviewed in [employer] record.  ROS  The following are not active complaints unless  bolded Hoarseness, sore throat, dysphagia, dental problems, itching, sneezing,  nasal congestion or discharge of excess mucus or purulent secretions, ear ache,   fever, chills, sweats, unintended wt loss or wt gain, classically pleuritic or exertional cp,  orthopnea pnd or arm/hand swelling  or leg swelling, presyncope, palpitations, abdominal pain, anorexia, nausea, vomiting, diarrhea  or change in bowel habits or change in bladder habits,  change in stools or change in urine, dysuria, hematuria,  rash, arthralgias, visual complaints, headache, numbness, weakness or ataxia or problems with walking or coordination,  change in mood or  memory.        Current Meds  Medication Sig   albuterol  (PROVENTIL ) (2.5 MG/3ML) 0.083% nebulizer solution Take 3 mLs (2.5 mg total) by nebulization every 4 (four) hours as needed.   albuterol  (VENTOLIN  HFA) 108 (90 Base) MCG/ACT inhaler    ALPRAZolam (XANAX) 1 MG tablet Take 0.5-1 mg by mouth 2 (two) times daily as needed.   Ascorbic Acid (VITAMIN C) 1000 MG tablet Take 1,000 mg by mouth daily.   atenolol  (TENORMIN ) 25 MG tablet Take 1 tablet (25 mg total) by mouth daily.   butalbital-acetaminophen -caffeine (FIORICET, ESGIC) 50-325-40 MG per tablet Take 1 tablet by mouth every 4 (four) hours as needed for headache or migraine.   Estradiol 1 MG/GM GEL Place 1 application onto the skin every morning.   lidocaine  (LIDODERM ) 5 % Place 1 patch onto the skin daily.   LINZESS 72 MCG capsule Take 72 mcg by mouth every morning.   mometasone -formoterol  (DULERA) 100-5 MCG/ACT AERO Take 2 puffs first thing in am and then another 2 puffs about 12 hours later.   NONFORMULARY OR COMPOUNDED ITEM Apply 1 application topically at bedtime. Progesterone   therapeutic multivitamin-minerals (THERAGRAN-M) tablet Take 1 tablet by mouth daily.   traMADol  (ULTRAM ) 50 MG tablet TAKE 1 TABLET BY MOUTH EVERY 6 HOURS AS NEEDED FOR SEVERE PAIN( MAX OF 20 TABS PER MONTH)   traZODone (DESYREL) 100  MG tablet Take 50-100 mg by mouth at bedtime.   UNABLE TO FIND Med Name: Omega 3   valACYclovir (VALTREX) 500 MG tablet Take 500 mg by mouth daily.   zinc gluconate 50 MG tablet Take 50 mg by mouth daily.   [DISCONTINUED] albuterol  (PROVENTIL ) (2.5 MG/3ML) 0.083% nebulizer solution Take 2.5 mg by nebulization every 6 (six) hours as needed.   [DISCONTINUED] Fluticasone -Umeclidin-Vilant (TRELEGY ELLIPTA) 100-62.5-25 MCG/ACT AEPB Inhale into the lungs.             Objective:   Physical Exam   Wt  12/18/2023          155  05/02/2022        162  10/19/2020        157  12/09/2019      161  07/23/2014        160  > 03/10/2016   161 >  11/13/2018  162     10/08/13 160 lb (72.576 kg)  09/09/13 162 lb 12.8 oz (73.846 kg)  04/14/13 161 lb (73.029 kg)    Vital signs reviewed  12/18/2023  - Note at rest 02 sats  96% on RA   General appearance:    anxious amb wf nad   HEENT : Oropharynx  clear      Nasal turbinates nl   NECK :  without  apparent JVD/ palpable Nodes/TM   LUNGS: no acc muscle use,  Nl contour chest with  minimal insp/exp rhonchi  bilaterally without cough on insp or exp maneuvers   CV:  RRR  no s3 or murmur or increase in P2, and no edema   ABD:  soft and nontender   MS:  Gait nl   ext warm without deformities Or obvious joint restrictions  calf tenderness, cyanosis or clubbing    SKIN: warm and dry without lesions    NEURO:  alert, approp, nl sensorium with  no motor or cerebellar deficits apparent.     CXR PA and Lateral:   12/18/2023 :    I personally reviewed images and impression is as follows:     Minimal increase markings/ no as dz       Assessment & Plan:

## 2023-12-18 ENCOUNTER — Ambulatory Visit (HOSPITAL_COMMUNITY)
Admission: RE | Admit: 2023-12-18 | Discharge: 2023-12-18 | Disposition: A | Discharge status: Home / Self Care | Payer: Medicare HMO | Source: Ambulatory Visit | Attending: Internal Medicine | Admitting: Internal Medicine

## 2023-12-18 ENCOUNTER — Ambulatory Visit: Payer: Medicare HMO | Admitting: Internal Medicine

## 2023-12-18 ENCOUNTER — Encounter: Payer: Self-pay | Admitting: Internal Medicine

## 2023-12-18 VITALS — BP 125/65 | HR 68 | Ht 69.5 in | Wt 155.0 lb

## 2023-12-18 DIAGNOSIS — R918 Other nonspecific abnormal finding of lung field: Secondary | ICD-10-CM

## 2023-12-18 MED ORDER — ALBUTEROL SULFATE (2.5 MG/3ML) 0.083% IN NEBU: 2.5000 mg | INHALATION_SOLUTION | RESPIRATORY_TRACT | 12 refills | Status: AC | PRN

## 2023-12-18 MED ORDER — PANTOPRAZOLE SODIUM 40 MG PO TBEC: 40.0000 mg | DELAYED_RELEASE_TABLET | Freq: Every day | ORAL | 2 refills | Status: AC

## 2023-12-18 MED ORDER — MOMETASONE FURO-FORMOTEROL FUM 100-5 MCG/ACT IN AERO: INHALATION_SPRAY | RESPIRATORY_TRACT | 11 refills | Status: AC

## 2023-12-18 NOTE — Patient Instructions (Addendum)
 Pantoprazole  (protonix ) 40 mg   Take  30-60 min before first meal of the day and Pepcid  (famotidine )  20 mg after supper until return to office    Plan A = Automatic = Always=  Continue dulera 100 1- 2 puffs every 12 hours as needed   Work on inhaler technique:  relax and gently blow all the way out then take a nice smooth full deep breath back in, triggering the inhaler at same time you start breathing in.  Hold breath in for at least  5 seconds if you can. Blow out capital one  thru nose. Rinse and gargle with water when done.  If mouth or throat bother you at all,  try brushing teeth/gums/tongue with arm and hammer toothpaste/ make a slurry and gargle and spit out.     Plan B = Backup (to supplement plan A, not to replace it) Only use your albuterol  inhaler as a rescue medication to be used if you can't catch your breath by resting or doing a relaxed purse lip breathing pattern.  - The less you use it, the better it will work when you need it. - Ok to use the inhaler up to 2 puffs  every 4 hours if you must but call for appointment if use goes up over your usual need - Don't leave home without it !!  (think of it like the spare tire for your car)   Plan C = Crisis (instead of Plan B but only if Plan B stops working) - only use your albuterol  nebulizer if you first try Plan B and it fails to help > ok to use the nebulizer up to every 4 hours but if start needing it regularly call for immediate appointment        For cough > mucinex dm 1200 mg every 12 hours as needed   Depomedrol 120 mg IM today   Please remember to go to the  x-ray department  @  [facility] for your tests - we will call you with the results when they are available     Please schedule a follow up office visit in 6-8  weeks, call sooner if needed [place] office

## 2023-12-19 ENCOUNTER — Telehealth: Payer: Self-pay

## 2023-12-19 MED ORDER — METHYLPREDNISOLONE ACETATE 80 MG/ML IJ SUSP
120.0000 mg | Freq: Once | INTRAMUSCULAR | Status: AC
  Administered 2023-12-18: 120 mg via INTRAMUSCULAR

## 2023-12-19 NOTE — Telephone Encounter (Signed)
 Per pharmacy Elwin Sleight is not covered on patient's plan.  Preferred are: Wixela Fluticasone Salmoterol Breo Budesonide Formoterol  Please advise, thank you!

## 2023-12-19 NOTE — Assessment & Plan Note (Signed)
 DX by  CT chest 2006 and 09/02/13 1. No acute cardiopulmonary abnormalities.  2. Spectrum of findings within the lungs, including peripheral  tree-in-bud nodularity and bronchiectasis, favor sequelae of chronic  indolent/atypical infection. 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  3. Small pericardial effusion PFT 10/08/13 wnl  - CT abd 02/29/16 Lower chest: In the inferior aspect of the lingula and anterior left lower lobe, there are multiple small indeterminate pulmonary nodules. Largest has mean diameter of 6 mm on image 7 of series 5. These are new since 2010 exam -CT chest  repeat 01/12/16 Multiple bilateral pulmonary nodules. When compared to chest CT 09/02/2013, there is a new nodule within the right middle lobe, left upper lobe and superior segment right lower lobe. The largest within the right lower lobe has a mean dimension of 9 mm. Non-contrast chest CT at 3-6 months is recommended> rec 6  m study in reminder file > did not do  - repeat CT  09/09/2018 >>> 1. 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 Findings overall mildly worsened since 01/11/2017 chest CT. 2. Stable chronic small pericardial effusion/thickening. 3. One vessel coronary atherosclerosis  06/09/2019  After extensive coaching inhaler device,  effectiveness =    90% > start symb 80 2bid 06/09/2019  Alpha one AT  MM 06/09/2019  Quant Ig's  wnl PFT's  10/19/2020  FEV1 2.22 (77 % ) ratio 0.77  p 2 % improvement from saba p 0 prior to study with DLCO  23.39 (100%) corrects to 5.15 (126%)  for alv volume and FV curve nl    Again reviewed the dx and rx for bronchiectasis with min airflow obst and how to manage chronic cough in this setting   - The  proper method of use, as well as anticipated side effects, of a metered-dose inhaler were discussed and demonstrated to the patient using teach back method > resume dulera 100 1-2 bid and  >>> also  added depomedrol 120 mg IM  in case of component of Th-2 driven upper or lower airways inflammation (if cough responds short term only to relapse before return while will on full rx for uacs (as above), then  that would point to allergic rhinitis/ asthma or eos bronchitis as alternative dx)   Advised on the roll gerd plays in pts with bronchiectasis and rec she just stay on max rx until next ov and regroup then   No further abx needed at this point.  F/u in 6-8 weeks [place] office         Each maintenance medication was reviewed in detail including emphasizing most importantly the difference between maintenance and prns and under what circumstances the prns are to be triggered using an action plan format where appropriate.  Total time for H and P, chart review, counseling, reviewing hfa/neb device(s) and generating customized AVS unique to this office visit / same day charting  > 40 min acute eval for pt not seen in > 1 y with refractory symptoms of uncertain etiology

## 2023-12-19 NOTE — Telephone Encounter (Signed)
 Budesonide / formoterol 80-4.5 Take 2 puffs first thing in am and then another 2 puffs about 12 hours later.   This is generic symbicort and just as good as dulera 100

## 2023-12-20 NOTE — Telephone Encounter (Signed)
 Pt paid for dulera,

## 2023-12-20 NOTE — Telephone Encounter (Signed)
 Med pended  I have called the pt to let her know of the change and there was no answer- LMTCB

## 2024-01-28 NOTE — Progress Notes (Deleted)
 Subjective:   Patient ID: [name], female    DOB: Jan 11, 1951   MRN: [id number]    Brief patient profile:  72 yowf  MM quit smoking 1979 s sequelae with sinus infections dating back to the 90's with fall> spring pnds s/p sinus surgery in 90's then sick p trip to [place] mid August 3 days after arrival with sinus pain bad cough rx by Haroldine Laws with nasty mucus (green) with abx cleared the mucus but worse cough dx as bronchitis > cxr > no acute changes > ct abn so referred by Dr Tyson Dense to pulmonary clinic 09/09/13 with dx of bronchiectasis 09/02/13 assoc with mpns    History of Present Illness  09/09/2013 1st [facility] office visit/ [name]  Chief Complaint  Patient presents with   Pulmonary Consult    Referred per Dr Marcy Salvo for eval of abnormal ct chest. Pt c/o dyspnea and pain between shoulder blades x 6 wks.    cp only centered in back not pleuritic but rather positional  Feels heaviness in chest worse first thing in am  Cough is better but still present,  Mostly dry hack and urge to clear throat day > night rec Pantoprazole (protonix) 40 mg   Take 30-60 min before first meal of the day and Pepcid 20 mg one bedtime until return to office - this is the best way to tell whether stomach acid is contributing to your problem.   GERD diet   10/08/2013 f/u ov/[name] re: cough/ sob ? UACS Chief Complaint  Patient presents with   Followup with PFT    Back pain is unchanged since last visit. Cough has improved some. Her dyspnea is slightly improved.   although definitely better,  Does not feel any benefit from acid suppression or diet isues, thinks it's all weather/ season related. No more chest heaviness in ams rec Bronchiectasis =   you have scarring of your bronchial tubes  Ok to try off the acid suppression after 4 weeks of therapy to see what difference if any this makes and start back immediately if cough flares for any reason.   07/23/2014 f/u ov/[name] re: recurrent cough, did  not activate action plan  Chief Complaint  Patient presents with   Acute Visit    Pt c/o trouble taking a deep breath in the morning and intermittnly throughout the day. C/o increase in work of breathing, dry cough and chest heaviness and mid upper back x 1 week.    onset 2 weeks abrupt sensation of palpitations "with skip beat" Then 1 week prior to OV  Sensation of not being able to take a deep breath rx with alb due to findings on pfts but not really much better and caused palp worse so not suing  No change ex tol/ no sob walking, no trouble sitting, sleeping   Problems with speaking with hoarseness, sob  Pain is midline has same pattern and characteristics as prev episodes  rec Pantoprazole (protonix) 40 mg   Take 30-60 min before first meal of the day and Pepcid 20 mg one bedtime until return to office   GERD diet  Best inhaler should you need one to use as needed is  xopenex 1-2 puffs every 4 hours and does not cause heart racing      10/19/2020  f/u ov/[name] re: bronchiectasis s airflow obst / neg allergy profile 06/09/2019 Chief Complaint  Patient presents with   Follow-up    go over pft results  Dyspnea:  Back stops her before breathing / worse p eating or speakers Cough: better now /  Sleeping: bed is flat/ one pillow  SABA use: none  02: none  Rec Plan A = Automatic = Always=   Dulera 100 2 puffs every 12 hours for a full week then ok to use as needed  Plan B = Backup (to supplement plan A, not to replace it) Only use your albuterol inhaler as a rescue medication Try albuterol 15 min before an activity that you know would make you short of breath     If not improving after a few days, then restart protonix 40 mg Take 30- 60 min before your first and last meals of the day  Make sure you check your oxygen saturation at your highest level of activity to be sure it stays over 90%     05/02/2022  f/u ov/[name] re: bronchiectasis maint on otc antihistamines   Chief Complaint   Patient presents with   Follow-up  Dyspnea:  worse off dulera  100  Cough: clear mucus Sleeping: fine / one pillow  SABA use: none  02: none  Covid status:   none / infected x 3  "only really hurts unhealthy people and I've heard bad things about the vaccine"  made her aware bronchiectasis and age 49 does put her at risk.  Rec Pantoprazole (protonix) 40 mg   Take  30-60 min before first meal of the day and Pepcid (famotidine)  20 mg after supper until return to office  If doing great after six weeks ok to stop pantoprazole and replace with pepcid 20 mg after breakfast  Dulera 100 Take 1-2 puffs first thing in am and then another 2 puffs about 12 hours later.  For cough /congestion >>  mucinex dm up to 1200 mg every 12 hours as needed  Please schedule a follow up visit in 3 months but call sooner if needed  Rec Pantoprazole (protonix) 40 mg   Take  30-60 min before first meal of the day and Pepcid (famotidine)  20 mg after supper until return to office If doing great after six weeks ok to stop pantoprazole and replace with pepcid 20 mg after breakfast  Dulera 100 Take 1-2 puffs first thing in am and then another 2 puffs about 12 hours later.  For cough /congestion >>  mucinex dm up to 1200 mg every 12 hours as needed     12/18/2023  ACUTE  ov/[place] office/[name] re: bronchiectasis / sick since early Dec with "doble pna" (no cxrays available) with pesistent "chest congestion"  Chief Complaint  Patient presents with   Cough   Acute Visit  Dyspnea:  minimal doe  Cough: mostly clear mucus esp in am assoc with overt HB not on gerd rx as advised Sleeping: better  now s   resp cc lying flat in bed SABA use: minimal / no neb use needed  02: none  Rec     01/30/2024  f/u ov/[name] re: ***   maint on ***  No chief complaint on file.   Dyspnea:  *** Cough: *** Sleeping: *** resp cc  SABA use: *** 02: ***  Lung cancer screening :  ***    No obvious day to day or daytime  variability or assoc excess/ purulent sputum or mucus plugs or hemoptysis or cp or chest tightness, subjective wheeze or overt sinus or hb symptoms.    Also denies any obvious fluctuation of symptoms with weather or environmental changes  or other aggravating or alleviating factors except as outlined above   No unusual exposure hx or h/o childhood pna/ asthma or knowledge of premature birth.  Current Allergies, Complete Past Medical History, Past Surgical History, Family History, and Social History were reviewed in [employer] record.  ROS  The following are not active complaints unless bolded Hoarseness, sore throat, dysphagia, dental problems, itching, sneezing,  nasal congestion or discharge of excess mucus or purulent secretions, ear ache,   fever, chills, sweats, unintended wt loss or wt gain, classically pleuritic or exertional cp,  orthopnea pnd or arm/hand swelling  or leg swelling, presyncope, palpitations, abdominal pain, anorexia, nausea, vomiting, diarrhea  or change in bowel habits or change in bladder habits, change in stools or change in urine, dysuria, hematuria,  rash, arthralgias, visual complaints, headache, numbness, weakness or ataxia or problems with walking or coordination,  change in mood or  memory.        No outpatient medications have been marked as taking for the 01/30/24 encounter (Appointment) with Nyoka Cowden, MD.            Objective:   Physical Exam   Wt  01/30/2024         ***  12/18/2023          155  05/02/2022        162  10/19/2020        157  12/09/2019      161  07/23/2014        160  > 03/10/2016   161 >  11/13/2018  162     10/08/13 160 lb (72.576 kg)  09/09/13 162 lb 12.8 oz (73.846 kg)  04/14/13 161 lb (73.029 kg)    Vital signs reviewed  01/30/2024  - Note at rest 02 sats  ***% on ***   General appearance:    ***   minimal insp/exp rhonchi  bilaterally ***        Assessment & Plan:

## 2024-01-30 ENCOUNTER — Ambulatory Visit: Payer: Medicare HMO | Admitting: Internal Medicine

## 2024-02-25 ENCOUNTER — Other Ambulatory Visit: Payer: Self-pay

## 2024-02-25 ENCOUNTER — Other Ambulatory Visit (INDEPENDENT_AMBULATORY_CARE_PROVIDER_SITE_OTHER): Payer: Self-pay

## 2024-02-25 ENCOUNTER — Ambulatory Visit: Admitting: Orthopedic Surgery

## 2024-02-25 ENCOUNTER — Telehealth: Payer: Self-pay

## 2024-02-25 ENCOUNTER — Encounter: Payer: Self-pay | Admitting: Orthopedic Surgery

## 2024-02-25 DIAGNOSIS — M25551 Pain in right hip: Secondary | ICD-10-CM

## 2024-02-25 DIAGNOSIS — M7061 Trochanteric bursitis, right hip: Secondary | ICD-10-CM

## 2024-02-25 DIAGNOSIS — M25561 Pain in right knee: Secondary | ICD-10-CM | POA: Diagnosis not present

## 2024-02-25 NOTE — Telephone Encounter (Signed)
Auth needed for right knee gel  

## 2024-02-25 NOTE — Progress Notes (Unsigned)
 Office Visit Note   Patient: [name]           Date of Birth: 02/04/1951           MRN: [id number] Visit Date: 02/25/2024 Requested by: Fleet Contras, MD [address] [place],  [place] [address] PCP: Fleet Contras, MD  Subjective: Chief Complaint  Patient presents with   Right Knee - Pain   Right Hip - Pain    HPI: [name] is a 73 y.o. female who presents to the office reporting bilateral knee pain as well as right hip pain.  She did have prior right hip iliotibial band release for trochanteric bursitis about 10 years ago.  She seen some physicians at [facility] who really could not offer any definitive treatment.  Denies any groin pain but does report pain in the trochanteric region.  Stairs are painful.  She does get spine injections at [facility] as well.  She does take sleep medicine.  Recently had a grandchild who lives in [place].  Takes ibuprofen and Tylenol for symptoms.  She also reports right knee pain.  Has a known history of bilateral knee patellofemoral arthritis.  Tried red laser therapy which did not help..                ROS: All systems reviewed are negative as they relate to the chief complaint within the history of present illness.  Patient denies fevers or chills.  Assessment & Plan: Visit Diagnoses:  1. Pain in right hip   2. Right knee pain, unspecified chronicity     Plan: Impression is bilateral knee patellofemoral arthritis worse on the right-hand side.  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  We will try to see if we can get her a brace for that.  The when she has she did receive from here.  Regarding the knee I think we can preapproved gel injection for both knees to see if that helps.  Regarding the hip we will try a trochanteric injection.  Her point of maximal tenderness is over the tip of the trochanter.  [name] understandably is not really interested in any type of surgery.  If the situation becomes untenable consideration could be given  to trochanteric shaving but that would be somewhat of an unknown quantity.  Follow-Up Instructions: No follow-ups on file.   Orders:  Orders Placed This Encounter  Procedures   XR HIP UNILAT W OR W/O PELVIS 2-3 VIEWS RIGHT   XR KNEE 3 VIEW RIGHT   US Guided Needle Placement - No Linked Charges   No orders of the defined types were placed in this encounter.     Procedures: Large Joint Inj: R greater trochanter on 02/25/2024 7:08 PM Indications: pain and diagnostic evaluation Details: 18 G 3.5 in needle, ultrasound-guided lateral approach  Arthrogram: No  Medications: 5 mL lidocaine 1 %; 80 mg methylPREDNISolone acetate 80 MG/ML; 4 mL bupivacaine 0.25 % Outcome: tolerated well, no immediate complications Procedure, treatment alternatives, risks and benefits explained, specific risks discussed. Consent was given by the patient. Immediately prior to procedure a time out was called to verify the correct patient, procedure, equipment, support staff and site/side marked as required. Patient was prepped and draped in the usual sterile fashion.       Clinical Data: No additional findings.  Objective: Vital Signs: There were no vitals taken for this visit.  Physical Exam:  Constitutional: Patient appears well-developed HEENT:  Head: Normocephalic Eyes:EOM are normal Neck: Normal range  of motion Cardiovascular: Normal rate Pulmonary/chest: Effort normal Neurologic: Patient is alert Skin: Skin is warm Psychiatric: Patient has normal mood and affect  Ortho Exam: Ortho exam demonstrates mild crepitus in that left thumb at the [facility] joint to grind testing.  Otherwise her grip strength is intact.  Does have tenderness over the trochanteric region on the right-hand side with no groin pain on the right hand side.  She does have level pelvis when standing on both the right foot and the left foot.  Hip flexion strength is symmetric bilaterally.  Both knees are examined.  She does have  patellofemoral crepitus but stable collateral cruciate ligaments and no effusion. This patient is diagnosed with osteoarthritis of the knee(s).    Radiographs show evidence of joint space narrowing, osteophytes, subchondral sclerosis and/or subchondral cysts.  This patient has knee pain which interferes with functional and activities of daily living.    This patient has experienced inadequate response, adverse effects and/or intolerance with conservative treatments such as acetaminophen, NSAIDS, topical creams, physical therapy or regular exercise, knee bracing and/or weight loss.   This patient has experienced inadequate response or has a contraindication to intra articular steroid injections for at least 3 months.   This patient is not scheduled to have a total knee replacement within 6 months of starting treatment with viscosupplementation.   Specialty Comments:  No specialty comments available.  Imaging: XR HIP UNILAT W OR W/O PELVIS 2-3 VIEWS RIGHT Result Date: 02/25/2024 AP pelvis lateral radiographs right hip reviewed.  No degenerative arthritis is present in the hip joint.  No acute fracture.  No enthesopathic changes around the trochanteric regions.  XR KNEE 3 VIEW RIGHT Result Date: 02/25/2024 AP lateral merchant radiographs right knee reviewed.  Severe end-stage patellofemoral arthritis is present with bone-on-bone changes.  Patella is well centered in the trochlear groove.  Minimal degenerative changes in the medial and lateral compartments  US Guided Needle Placement - No Linked Charges Result Date: 02/25/2024 Ultrasound imaging demonstrates normal placement over the right greater trochanteric region with extravasation of fluid around the trochanter and no complicating features    PMFS History: Patient Active Problem List   Diagnosis Date Noted   Chronic constipation 11/21/2023   Peripheral vertigo 08/27/2023   Essential hypertension 08/12/2023   Elevated coronary artery  calcium score 08/12/2023   Anxiety 06/11/2023   Dysphagia 06/11/2023   Irritable bowel syndrome 06/11/2023   Migraine 06/11/2023   COVID-19 virus infection 01/20/2021   ETD (Eustachian tube dysfunction), bilateral 01/10/2021   Primary osteoarthritis of right knee 04/14/2020   Primary osteoarthritis of left knee 04/14/2020   Chronic daily headache 02/22/2018   Primary insomnia 02/22/2018   Seasonal allergies 02/22/2018   Aortic valve sclerosis 09/06/2016   Cough 09/11/2013   Multiple pulmonary nodules assoc with bronchiectasis 09/11/2013   Chronic interstitial cystitis 01/01/2013   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   Osteoarthritis 03/25/2012   Heart murmur    Dyspnea    DOE (dyspnea on exertion)    Lightheadedness    Chest pain    Palpitation    Past Medical History:  Diagnosis Date   Arthritis    lower back   Carpal tunnel syndrome of right wrist    Complication of anesthesia    felt funny afterward, sleep did help some   Dysrhythmia    rapid heart rate at night   Headache(784.0)    migraines   Hepatitis 12/11/1974   hep A   Palpitation  Family History  Problem Relation Age of Onset   Kidney failure Mother    Lung cancer Father        smoked   Emphysema Father        smoked    Past Surgical History:  Procedure Laterality Date   ABDOMINAL HYSTERECTOMY     BACK SURGERY     x 2   COLONOSCOPY     ELBOW SURGERY Right 2008   tennis elbow   EXCISION/RELEASE BURSA HIP Right 02/17/2014   Procedure: EXCISION/RELEASE BURSA HIP;  Surgeon: Cammy Copa, MD;  Location: [facility] OR;  Service: Orthopedics;  Laterality: Right;  RIGHT HIP TENSOR FASCIAL LATERAL RELEASE.   NASAL SINUS SURGERY     PARTIAL HYSTERECTOMY     Social History   Occupational History   Occupation: Pastry Chef  Tobacco Use   Smoking status: Former    Current packs/day: 0.00    Average packs/day: 0.3 packs/day for 9.0 years (2.3 ttl pk-yrs)    Types: Cigarettes    Start date:  04/14/1969    Quit date: 04/14/1978    Years since quitting: 45.8   Smokeless tobacco: Never  Substance and Sexual Activity   Alcohol use: No   Drug use: No   Sexual activity: Not on file

## 2024-02-26 ENCOUNTER — Telehealth: Payer: Self-pay | Admitting: Orthopedic Surgery

## 2024-02-26 NOTE — Telephone Encounter (Signed)
 VOB has been submitted for Durolane, right knee

## 2024-02-26 NOTE — Telephone Encounter (Signed)
 Pt called requesting a different gel injection. Synivisc One does not work for pt. Please submit to insurance a different type per Dr dean. Pt phone number is [phone].

## 2024-02-26 NOTE — Telephone Encounter (Signed)
 Submitted

## 2024-02-27 ENCOUNTER — Encounter: Payer: Self-pay | Admitting: Orthopedic Surgery

## 2024-02-27 ENCOUNTER — Telehealth: Payer: Self-pay

## 2024-02-27 MED ORDER — LIDOCAINE HCL 1 % IJ SOLN
5.0000 mL | INTRAMUSCULAR | Status: AC | PRN
  Administered 2024-02-25: 5 mL

## 2024-02-27 MED ORDER — METHYLPREDNISOLONE ACETATE 80 MG/ML IJ SUSP
80.0000 mg | INTRAMUSCULAR | Status: AC | PRN
  Administered 2024-02-25: 80 mg via INTRA_ARTICULAR

## 2024-02-27 MED ORDER — BUPIVACAINE HCL 0.25 % IJ SOLN
4.0000 mL | INTRAMUSCULAR | Status: AC | PRN
  Administered 2024-02-25: 4 mL via INTRA_ARTICULAR

## 2024-02-27 NOTE — Telephone Encounter (Signed)
 Talked to patient to clarify which knee for gel injection.  Patient advised right knee only.  Advised patient that this has been submitted.

## 2024-02-27 NOTE — Telephone Encounter (Signed)
 Auth needed for bil knee gel

## 2024-03-02 NOTE — Progress Notes (Deleted)
 Subjective:   Patient ID: [name], female    DOB: [birth year]   MRN: [id number]    Brief patient profile:  72 yowf  MM quit smoking 1979 s sequelae with sinus infections dating back to the 90's with fall> spring pnds s/p sinus surgery in 90's then sick p trip to [place] mid August 3 days after arrival with sinus pain bad cough rx by Haroldine Laws with nasty mucus (green) with abx cleared the mucus but worse cough dx as bronchitis > cxr > no acute changes > ct abn so referred by Dr Tyson Dense to pulmonary clinic 09/09/13 with dx of bronchiectasis 09/02/13 assoc with mpns    History of Present Illness  09/09/2013 1st [facility] office visit/ [name]  Chief Complaint  Patient presents with   Pulmonary Consult    Referred per Dr Marcy Salvo for eval of abnormal ct chest. Pt c/o dyspnea and pain between shoulder blades x 6 wks.    cp only centered in back not pleuritic but rather positional  Feels heaviness in chest worse first thing in am  Cough is better but still present,  Mostly dry hack and urge to clear throat day > night rec Pantoprazole (protonix) 40 mg   Take 30-60 min before first meal of the day and Pepcid 20 mg one bedtime until return to office - this is the best way to tell whether stomach acid is contributing to your problem.   GERD diet   10/08/2013 f/u ov/[name] re: cough/ sob ? UACS Chief Complaint  Patient presents with   Followup with PFT    Back pain is unchanged since last visit. Cough has improved some. Her dyspnea is slightly improved.   although definitely better,  Does not feel any benefit from acid suppression or diet isues, thinks it's all weather/ season related. No more chest heaviness in ams rec Bronchiectasis =   you have scarring of your bronchial tubes  Ok to try off the acid suppression after 4 weeks of therapy to see what difference if any this makes and start back immediately if cough flares for any reason.   07/23/2014 f/u ov/[name] re: recurrent cough, did  not activate action plan  Chief Complaint  Patient presents with   Acute Visit    Pt c/o trouble taking a deep breath in the morning and intermittnly throughout the day. C/o increase in work of breathing, dry cough and chest heaviness and mid upper back x 1 week.    onset 2 weeks abrupt sensation of palpitations "with skip beat" Then 1 week prior to OV  Sensation of not being able to take a deep breath rx with alb due to findings on pfts but not really much better and caused palp worse so not suing  No change ex tol/ no sob walking, no trouble sitting, sleeping   Problems with speaking with hoarseness, sob  Pain is midline has same pattern and characteristics as prev episodes  rec Pantoprazole (protonix) 40 mg   Take 30-60 min before first meal of the day and Pepcid 20 mg one bedtime until return to office   GERD diet  Best inhaler should you need one to use as needed is  xopenex 1-2 puffs every 4 hours and does not cause heart racing    10/19/2020  f/u ov/[name] re: bronchiectasis s airflow obst / neg allergy profile 06/09/2019 Chief Complaint  Patient presents with   Follow-up    go over pft results  Dyspnea:  Back  stops her before breathing / worse p eating or speakers Cough: better now /  Sleeping: bed is flat/ one pillow  SABA use: none  02: none  Rec Plan A = Automatic = Always=   Dulera 100 2 puffs every 12 hours for a full week then ok to use as needed  Plan B = Backup (to supplement plan A, not to replace it) Only use your albuterol inhaler as a rescue medication Try albuterol 15 min before an activity that you know would make you short of breath     If not improving after a few days, then restart protonix 40 mg Take 30- 60 min before your first and last meals of the day  Make sure you check your oxygen saturation at your highest level of activity to be sure it stays over 90%     05/02/2022  f/u ov/[name] re: bronchiectasis maint on otc antihistamines   Chief Complaint   Patient presents with   Follow-up  Dyspnea:  worse off dulera  100  Cough: clear mucus Sleeping: fine / one pillow  SABA use: none  02: none  Covid status:   none / infected x 3  "only really hurts unhealthy people and I've heard bad things about the vaccine"  made her aware bronchiectasis and age 44 does put her at risk.  Rec Pantoprazole (protonix) 40 mg   Take  30-60 min before first meal of the day and Pepcid (famotidine)  20 mg after supper until return to office  If doing great after six weeks ok to stop pantoprazole and replace with pepcid 20 mg after breakfast  Dulera 100 Take 1-2 puffs first thing in am and then another 2 puffs about 12 hours later.  For cough /congestion >>  mucinex dm up to 1200 mg every 12 hours as needed  Rec    03/07/2024  f/u ov/[name] office/[name] re: *** maint on ***  No chief complaint on file.   Dyspnea:  *** Cough: *** Sleeping: ***   resp cc  SABA use: *** 02: ***  Lung cancer screening: ***   No obvious day to day or daytime variability or assoc excess/ purulent sputum or mucus plugs or hemoptysis or cp or chest tightness, subjective wheeze or overt sinus or hb symptoms.    Also denies any obvious fluctuation of symptoms with weather or environmental changes or other aggravating or alleviating factors except as outlined above   No unusual exposure hx or h/o childhood pna/ asthma or knowledge of premature birth.  Current Allergies, Complete Past Medical History, Past Surgical History, Family History, and Social History were reviewed in [employer] record.  ROS  The following are not active complaints unless bolded Hoarseness, sore throat, dysphagia, dental problems, itching, sneezing,  nasal congestion or discharge of excess mucus or purulent secretions, ear ache,   fever, chills, sweats, unintended wt loss or wt gain, classically pleuritic or exertional cp,  orthopnea pnd or arm/hand swelling  or leg swelling,  presyncope, palpitations, abdominal pain, anorexia, nausea, vomiting, diarrhea  or change in bowel habits or change in bladder habits, change in stools or change in urine, dysuria, hematuria,  rash, arthralgias, visual complaints, headache, numbness, weakness or ataxia or problems with walking or coordination,  change in mood or  memory.        No outpatient medications have been marked as taking for the 03/07/24 encounter (Appointment) with Nyoka Cowden, MD.  Objective:   Physical Exam   Wt  03/07/2024         ***  12/18/2023          155  05/02/2022        162  10/19/2020        157  12/09/2019      161  07/23/2014        160  > 03/10/2016   161 >  11/13/2018  162     10/08/13 160 lb (72.576 kg)  09/09/13 162 lb 12.8 oz (73.846 kg)  04/14/13 161 lb (73.029 kg)    Vital signs reviewed  03/07/2024  - Note at rest 02 sats  ***% on ***   General appearance:    ***      minimal insp/exp rhonchi  bilaterally***        Assessment & Plan:

## 2024-03-07 ENCOUNTER — Ambulatory Visit: Payer: Medicare HMO | Admitting: Internal Medicine

## 2024-03-10 ENCOUNTER — Telehealth: Payer: Self-pay

## 2024-03-10 ENCOUNTER — Other Ambulatory Visit: Payer: Self-pay

## 2024-03-10 DIAGNOSIS — M25561 Pain in right knee: Secondary | ICD-10-CM

## 2024-03-10 NOTE — Telephone Encounter (Signed)
 Patient is approved for gel injection.  Will CB to schedule.  Gel information can be found under the referrals tab.

## 2024-03-14 ENCOUNTER — Telehealth: Payer: Self-pay | Admitting: Orthopedic Surgery

## 2024-03-14 NOTE — Telephone Encounter (Signed)
 I called and advised pt she was ok to have gel injection. Patient would like to be seen Monday, after 12 on Tuesday, or Wednesday for the gel injection if possible

## 2024-03-14 NOTE — Telephone Encounter (Signed)
 Patient called. Would like to know how long she should wait to get the gel injection in her knee? Her cb# [phone]

## 2024-03-17 NOTE — Telephone Encounter (Signed)
 Need to verify with Dr August Saucer how soon he would like for patient to receive gel.

## 2024-03-17 NOTE — Telephone Encounter (Signed)
 Patient called back to schedule for gel injection due to leaving to go out of town on Thursday 03/20/2024.

## 2024-03-19 ENCOUNTER — Ambulatory Visit: Admitting: Orthopedic Surgery

## 2024-03-19 DIAGNOSIS — M1711 Unilateral primary osteoarthritis, right knee: Secondary | ICD-10-CM

## 2024-03-19 MED ORDER — LIDOCAINE 5 % EX PTCH: MEDICATED_PATCH | CUTANEOUS | 0 refills | Status: AC

## 2024-03-21 ENCOUNTER — Encounter: Payer: Self-pay | Admitting: Orthopedic Surgery

## 2024-03-21 MED ORDER — BUPIVACAINE HCL 0.25 % IJ SOLN
4.0000 mL | INTRAMUSCULAR | Status: AC | PRN
  Administered 2024-03-19: 4 mL via INTRA_ARTICULAR

## 2024-03-21 MED ORDER — SODIUM HYALURONATE 60 MG/3ML IX PRSY
60.0000 mg | PREFILLED_SYRINGE | INTRA_ARTICULAR | Status: AC | PRN
Start: 2024-03-19 — End: 2024-03-19
  Administered 2024-03-19: 60 mg via INTRA_ARTICULAR

## 2024-03-21 MED ORDER — LIDOCAINE HCL 1 % IJ SOLN
5.0000 mL | INTRAMUSCULAR | Status: AC | PRN
  Administered 2024-03-19: 5 mL

## 2024-03-21 NOTE — Progress Notes (Signed)
   Procedure Note  Patient: [name]             Date of Birth: 04-15-1951           MRN: [id number]             Visit Date: 03/19/2024  Procedures: Visit Diagnoses:  1. Arthritis of right knee     Large Joint Inj: R knee on 03/19/2024 9:36 PM Indications: diagnostic evaluation, joint swelling and pain Details: 18 G 1.5 in needle, superolateral approach  Arthrogram: No  Medications: 5 mL lidocaine 1 %; 4 mL bupivacaine 0.25 %; 60 mg Sodium Hyaluronate 60 MG/3ML Outcome: tolerated well, no immediate complications Procedure, treatment alternatives, risks and benefits explained, specific risks discussed. Consent was given by the patient. Immediately prior to procedure a time out was called to verify the correct patient, procedure, equipment, support staff and site/side marked as required. Patient was prepped and draped in the usual sterile fashion.     [id number]

## 2024-06-16 ENCOUNTER — Telehealth: Payer: Self-pay | Admitting: Orthopedic Surgery

## 2024-06-16 NOTE — Telephone Encounter (Signed)
 Patient called and ask if you could give her a call because she did something to her hip and needed your advice. [phone]

## 2024-06-16 NOTE — Telephone Encounter (Signed)
 I called her.  Chiropractor tried some treatment on her in terms of dry needling and red light and her hip has been worse over the past 12 days.  She is taking steroids and antibiotic for an ear infection I think the steroids will help her hip as well.  Also said she could try some Voltaren cream topically to help.

## 2024-08-07 ENCOUNTER — Other Ambulatory Visit: Payer: Self-pay | Admitting: Cardiology

## 2024-08-07 MED ORDER — AMLODIPINE BESYLATE 2.5 MG PO TABS: 2.5000 mg | ORAL_TABLET | Freq: Every day | ORAL | 0 refills | Status: DC

## 2024-08-14 ENCOUNTER — Encounter: Payer: Self-pay | Admitting: Cardiology

## 2024-10-13 ENCOUNTER — Encounter: Payer: Self-pay | Admitting: Radiology

## 2024-10-31 ENCOUNTER — Ambulatory Visit: Admitting: Cardiology

## 2024-11-14 ENCOUNTER — Inpatient Hospital Stay (HOSPITAL_BASED_OUTPATIENT_CLINIC_OR_DEPARTMENT_OTHER): Admission: RE | Admit: 2024-11-14 | Discharge: 2024-11-14 | Payer: Self-pay

## 2024-11-14 ENCOUNTER — Encounter (HOSPITAL_BASED_OUTPATIENT_CLINIC_OR_DEPARTMENT_OTHER): Payer: Self-pay

## 2024-11-14 ENCOUNTER — Other Ambulatory Visit (HOSPITAL_BASED_OUTPATIENT_CLINIC_OR_DEPARTMENT_OTHER): Payer: Self-pay

## 2024-11-14 ENCOUNTER — Telehealth (HOSPITAL_BASED_OUTPATIENT_CLINIC_OR_DEPARTMENT_OTHER): Payer: Self-pay | Admitting: Family Medicine

## 2024-11-14 VITALS — BP 117/69 | HR 64 | Temp 98.3°F | Resp 20

## 2024-11-14 DIAGNOSIS — H00015 Hordeolum externum left lower eyelid: Secondary | ICD-10-CM

## 2024-11-14 MED ORDER — ERYTHROMYCIN 5 MG/GM OP OINT
TOPICAL_OINTMENT | OPHTHALMIC | 0 refills | Status: AC
  Filled 2024-11-14: qty 3.5, 7d supply, fill #0

## 2024-11-14 MED ORDER — ERYTHROMYCIN 5 MG/GM OP OINT: TOPICAL_OINTMENT | OPHTHALMIC | 0 refills | Status: DC

## 2024-11-14 NOTE — ED Provider Notes (Signed)
 [facility]    CSN: [id number] Arrival date & time: 11/14/24  1254      History   Chief Complaint Chief Complaint  Patient presents with   Eye Problem    I've had five styes in two months.  Been to two eye doctors and was given injection in one eye.  Got better.  Had another one and went to eye Dr .  He gave me an antibiotic- zpac.  Cleared up and now another one has shown up on other eye:  Looks bad - Entered by patient    HPI [name] is a 73 y.o. female.   Patient is a 73 year old female that presents for eval of frequent stye's to both eyes. Has had oral z-pack as well as injection in eye in an effort to try to improve condition. Patient has been using beef tallo topically to both eyes. Patient states most recent problem is to left eye. Puffy, blurred vision, sore. This has been x 2 days.   Eye Problem   Past Medical History:  Diagnosis Date   Arthritis    lower back   Carpal tunnel syndrome of right wrist    Complication of anesthesia    felt funny afterward, sleep did help some   Dysrhythmia    rapid heart rate at night   Headache(784.0)    migraines   Hepatitis 12/11/1974   hep A   Palpitation     Patient Active Problem List   Diagnosis Date Noted   Chronic constipation 11/21/2023   Peripheral vertigo 08/27/2023   Essential hypertension 08/12/2023   Elevated coronary artery calcium score 08/12/2023   Anxiety 06/11/2023   Dysphagia 06/11/2023   Irritable bowel syndrome 06/11/2023   Migraine 06/11/2023   COVID-19 virus infection 01/20/2021   ETD (Eustachian tube dysfunction), bilateral 01/10/2021   Primary osteoarthritis of right knee 04/14/2020   Primary osteoarthritis of left knee 04/14/2020   Chronic daily headache 02/22/2018   Primary insomnia 02/22/2018   Seasonal allergies 02/22/2018   Aortic valve sclerosis 09/06/2016   Cough 09/11/2013   Multiple pulmonary nodules assoc with bronchiectasis 09/11/2013   Chronic  interstitial cystitis 01/01/2013   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   Osteoarthritis 03/25/2012   Heart murmur    Dyspnea    DOE (dyspnea on exertion)    Lightheadedness    Chest pain    Palpitation     Past Surgical History:  Procedure Laterality Date   ABDOMINAL HYSTERECTOMY     BACK SURGERY     x 2   COLONOSCOPY     ELBOW SURGERY Right 2008   tennis elbow   EXCISION/RELEASE BURSA HIP Right 02/17/2014   Procedure: EXCISION/RELEASE BURSA HIP;  Surgeon: Cordella Glendia Hutchinson, MD;  Location: [facility] OR;  Service: Orthopedics;  Laterality: Right;  RIGHT HIP TENSOR FASCIAL LATERAL RELEASE.   NASAL SINUS SURGERY     PARTIAL HYSTERECTOMY      OB History   No obstetric history on file.      Home Medications    Prior to Admission medications   Medication Sig Start Date End Date Taking? Authorizing Provider  celecoxib (CELEBREX) 200 MG capsule Take 200 mg by mouth 2 (two) times daily. 10/22/24  Yes [provider]  gabapentin (NEURONTIN) 100 MG capsule Take 100 mg by mouth at bedtime. 11/04/24  Yes [provider]  methocarbamol (ROBAXIN) 500 MG tablet Take 500 mg by mouth every 6 (six) hours as needed. 11/04/24  Yes  [provider]  albuterol  (PROVENTIL ) (2.5 MG/3ML) 0.083% nebulizer solution Take 3 mLs (2.5 mg total) by nebulization every 4 (four) hours as needed. 12/18/23   Darlean Ozell NOVAK, MD  albuterol  (VENTOLIN  HFA) [phone] Base) MCG/ACT inhaler  12/17/20   [provider]  ALPRAZolam (XANAX) 1 MG tablet Take 0.5-1 mg by mouth 2 (two) times daily as needed.    [provider]  amLODipine  (NORVASC ) 2.5 MG tablet Take 1 tablet (2.5 mg total) by mouth daily. 08/07/24 11/05/24  Lavona Agent, MD  Ascorbic Acid (VITAMIN C) 1000 MG tablet Take 1,000 mg by mouth daily.    [provider]  atenolol  (TENORMIN ) 25 MG tablet Take 1 tablet (25 mg total) by mouth daily. 05/02/22   Lavona Agent, MD  butalbital-acetaminophen -caffeine  (FIORICET, ESGIC) 50-325-40 MG per tablet Take 1 tablet by mouth every 4 (four) hours as needed for headache or migraine.    [provider]  Cholecalciferol ([employer] VIT D 5000 HIGH-POTENCY PO) Take by mouth. Patient not taking: Reported on 12/18/2023    [provider]  erythromycin  ophthalmic ointment Place a 1/2 inch ribbon of ointment into the lower eyelid 3-4 times a day 11/14/24   Adah Corning A, FNP  Estradiol 1 MG/GM GEL Place 1 application onto the skin every morning.    [provider]  famotidine  (PEPCID ) 20 MG tablet One after supper Patient not taking: Reported on 12/18/2023 05/02/22   Darlean Ozell NOVAK, MD  lidocaine  (LIDODERM ) 5 % Place 1 patch onto the skin daily.    [provider]  lidocaine  (LIDODERM ) 5 % Remove & Discard patch within 12 hours or as directed by MD 03/19/24   Addie Cordella [place], MD  LINZESS 72 MCG capsule Take 72 mcg by mouth every morning.    [provider]  mometasone -formoterol  (DULERA) 100-5 MCG/ACT AERO Take 2 puffs first thing in am and then another 2 puffs about 12 hours later. 12/18/23   Darlean Ozell NOVAK, MD  NONFORMULARY OR COMPOUNDED ITEM Apply 1 application topically at bedtime. Progesterone    [provider]  pantoprazole  (PROTONIX ) 40 MG tablet Take 1 tablet (40 mg total) by mouth daily. Take 30-60 min before first meal of the day 12/18/23   Darlean Ozell NOVAK, MD  therapeutic multivitamin-minerals [facility]) tablet Take 1 tablet by mouth daily.    [provider]  traMADol  (ULTRAM ) 50 MG tablet TAKE 1 TABLET BY MOUTH EVERY 6 HOURS AS NEEDED FOR SEVERE PAIN( MAX OF 20 TABS PER MONTH) 04/29/19   Darlean Ozell NOVAK, MD  traZODone (DESYREL) 100 MG tablet Take 50-100 mg by mouth at bedtime. 12/10/23   [provider]  UNABLE TO FIND Med Name: Omega 3    [provider]  valACYclovir (VALTREX) 500 MG tablet Take 500 mg by mouth daily.    [provider]  zinc gluconate 50 MG tablet Take  50 mg by mouth daily.    [provider]    Family History Family History  Problem Relation Age of Onset   Kidney failure Mother    Lung cancer Father        smoked   Emphysema Father        smoked    Social History Social History   Tobacco Use   Smoking status: Former    Current packs/day: 0.00    Average packs/day: 0.3 packs/day for 9.0 years (2.3 ttl pk-yrs)    Types: Cigarettes    Start date: 04/14/1969  Quit date: 04/14/1978    Years since quitting: 46.6   Smokeless tobacco: Never  Substance Use Topics   Alcohol use: No   Drug use: No     Allergies   Sulfamethoxazole-trimethoprim and Diflucan [fluconazole]   Review of Systems Review of Systems See HPI  Physical Exam Triage Vital Signs ED Triage Vitals  Encounter Vitals Group     BP 11/14/24 1305 117/69     Girls Systolic BP Percentile --      Girls Diastolic BP Percentile --      Boys Systolic BP Percentile --      Boys Diastolic BP Percentile --      Pulse Rate 11/14/24 1305 64     Resp 11/14/24 1305 20     Temp 11/14/24 1305 98.3 F (36.8 C)     Temp Source 11/14/24 1305 Oral     SpO2 11/14/24 1305 95 %     Weight --      Height --      Head Circumference --      Peak Flow --      Pain Score 11/14/24 1307 4     Pain Loc --      Pain Education --      Exclude from Growth Chart --    No data found.  Updated Vital Signs BP 117/69 (BP Location: Right Arm)   Pulse 64   Temp 98.3 F (36.8 C) (Oral)   Resp 20   SpO2 95%   Visual Acuity Right Eye Distance:   Left Eye Distance:   Bilateral Distance:    Right Eye Near:   Left Eye Near:    Bilateral Near:     Physical Exam Vitals and nursing note reviewed.  Constitutional:      General: [name] is not in acute distress.    Appearance: Normal appearance. [name] is not ill-appearing, toxic-appearing or diaphoretic.  Eyes:     General:        Right eye: No discharge.        Left eye: No discharge.     Conjunctiva/sclera: Conjunctivae  normal.     Comments: Stye to inside of the left lower lid. Mild swelling to left lower lid and under the eye. Redness to face. Mildly tender to palpation.  Upper lid normal.   Pulmonary:     Effort: Pulmonary effort is normal.  Neurological:     Mental Status: [name] is alert.  Psychiatric:        Mood and Affect: Mood normal.      UC Treatments / Results  Labs (all labs ordered are listed, but only abnormal results are displayed) Labs Reviewed - No data to display  EKG   Radiology No results found.  Procedures Procedures (including critical care time)  Medications Ordered in UC Medications - No data to display  Initial Impression / Assessment and Plan / UC Course  I have reviewed the triage vital signs and the nursing notes.  Pertinent labs & imaging results that were available during my care of the patient were reviewed by me and considered in my medical decision making (see chart for details).     Stye-patient with stye to the left inside of lower lid .  Recommend continue warm compresses.  Treating with erythromycin  ointment at this time. Pictures given on how to use this.  Recommend follow-up for any worsening problems, swelling or pain. Final Clinical Impressions(s) / UC Diagnoses   Final diagnoses:  Hordeolum externum  of left lower eyelid     Discharge Instructions      Continue the warm compresses. Apply the ointment 3-4 times a day. No products on the face for now. If the problem worsens please follow up.     ED Prescriptions     Medication Sig Dispense Auth. Provider   erythromycin  ophthalmic ointment  (Status: Discontinued) Place a 1/2 inch ribbon of ointment into the lower eyelid 3-4 times a day 3.5 g Lovell Roe A, FNP   erythromycin  ophthalmic ointment Place a 1/2 inch ribbon of ointment into the lower eyelid 3-4 times a day 3.5 g Adah Corning A, FNP      PDMP not reviewed this encounter.   Adah Corning LABOR, FNP 11/15/24 [phone]

## 2024-11-14 NOTE — Telephone Encounter (Signed)
 Opened in error

## 2024-11-14 NOTE — ED Triage Notes (Signed)
 Presents for eval of frequent stye's to both eyes. Has had oral z-pack as well as injection in eye in an effort to try to improve condition. Patient has been using beef tallo topically to both eyes. Patient states most recent problem is to left eye. Puffy, blurred vision, sore. This has been x 2 days.

## 2024-11-14 NOTE — Discharge Instructions (Signed)
 Continue the warm compresses. Apply the ointment 3-4 times a day. No products on the face for now. If the problem worsens please follow up.

## 2024-11-17 ENCOUNTER — Other Ambulatory Visit: Payer: Self-pay

## 2024-11-20 MED ORDER — AMLODIPINE BESYLATE 2.5 MG PO TABS: 2.5000 mg | ORAL_TABLET | Freq: Every day | ORAL | 0 refills | Status: DC

## 2024-12-18 NOTE — Progress Notes (Signed)
 " Cardiology Office Note:   Date:  12/19/2024  ID:  [name], [name] 26-Sep-1951, MRN [id number] PCP: Shelda Atlas, MD  [facility] Providers Cardiologist:  Lynwood Schilling, MD {  History of Present Illness:   [name] is a 74 y.o. female who presents for evaluation of palpitations.  She has previously seen Dr. Dominick before he retired.  She was seeing another cardiologist in [facility] but was not satisfied.  I was able to review extensive notes.  She had palpitations.  These were managed with a low-dose of beta-blocker.  She mostly felt this at night when she would lie on her left side.  She seems to have done well with atenolol  over time.  She also has a heart murmur.  There was echocardiography dating back to 2012 and I see one from 2021 that demonstrated aortic sclerosis.  There were no other significant valvular abnormalities and she had a well-preserved ejection fraction.  I do also note that she has had coronary calcium noted on CT.  I see one from 2019.   Since I last saw her she has had no new cardiovascular complaints.  The patient denies any new symptoms such as chest discomfort, neck or arm discomfort. There has been no new shortness of breath, PND or orthopnea. There have been no reported palpitations, presyncope or syncope.   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  She is working with physical therapy.  She is starting to use a treadmill.  Had not had the episodes of vomiting that she had last year when I saw her.  ROS: As stated in the HPI and negative for all other systems.  Studies Reviewed:    EKG:   EKG Interpretation Date/Time:  Friday December 19 2024 11:17:19 EST Ventricular Rate:  60 PR Interval:  152 QRS Duration:  92 QT Interval:  420 QTC Calculation: 420 R Axis:   63  Text Interpretation: Normal sinus rhythm Normal ECG When compared with ECG of  23-Aug-2023 14:56, No significant change was found Confirmed by Schilling Rattan ([id number]) on 12/19/2024 11:26:14 AM    Risk Assessment/Calculations:              Physical Exam:   VS:  BP 130/66 (BP Location: Right Arm, Patient Position: Sitting, Cuff Size: Normal)   Pulse 67   Ht 5' 9 (1.753 m)   Wt 157 lb (71.2 kg)   SpO2 96%   BMI 23.18 kg/m    Wt Readings from Last 3 Encounters:  12/19/24 157 lb (71.2 kg)  12/18/23 155 lb (70.3 kg)  08/23/23 155 lb (70.3 kg)     GEN: Well nourished, well developed in no acute distress NECK: No JVD; No carotid bruits CARDIAC: RRR, no murmurs, rubs, gallops RESPIRATORY:  Clear to auscultation without rales, wheezing or rhonchi  ABDOMEN: Soft, non-tender, non-distended EXTREMITIES:  No edema; No deformity   ASSESSMENT AND PLAN:   Hypertension:    Her blood pressure is at target I started amlodipine  previously.  No change in therapy.  Coronary calcium : She does not recall having discussed this before but I did bring this up with her today again and we talked about risk reduction.  She would prefer still not to take a statin.   AS: This was mild.  I will check an echocardiogram again in September.  I will be able to follow  this clinically.  Follow up with me in 1 year  Signed, Lynwood Schilling, MD   "

## 2024-12-19 ENCOUNTER — Encounter: Payer: Self-pay | Admitting: Cardiology

## 2024-12-19 ENCOUNTER — Ambulatory Visit: Attending: Cardiology | Admitting: Cardiology

## 2024-12-19 VITALS — BP 130/66 | HR 67 | Ht 69.0 in | Wt 157.0 lb

## 2024-12-19 DIAGNOSIS — R931 Abnormal findings on diagnostic imaging of heart and coronary circulation: Secondary | ICD-10-CM | POA: Diagnosis not present

## 2024-12-19 DIAGNOSIS — R011 Cardiac murmur, unspecified: Secondary | ICD-10-CM | POA: Diagnosis not present

## 2024-12-19 DIAGNOSIS — I10 Essential (primary) hypertension: Secondary | ICD-10-CM

## 2024-12-19 DIAGNOSIS — I350 Nonrheumatic aortic (valve) stenosis: Secondary | ICD-10-CM

## 2024-12-19 NOTE — Patient Instructions (Addendum)
 Medication Instructions:  Your physician recommends that you continue on your current medications as directed. Please refer to the Current Medication list given to you today.  *If you need a refill on your cardiac medications before your next appointment, please call your pharmacy*  Lab Work: NONE If you have labs (blood work) drawn today and your tests are completely normal, you will receive your results only by: MyChart Message (if you have MyChart) OR A paper copy in the mail If you have any lab test that is abnormal or we need to change your treatment, we will call you to review the results.  Testing/Procedures: Echocardiogram in Sept 2026 Your physician has requested that you have an echocardiogram. Echocardiography is a painless test that uses sound waves to create images of your heart. It provides your doctor with information about the size and shape of your heart and how well your hearts chambers and valves are working. This procedure takes approximately one hour. There are no restrictions for this procedure. Please do NOT wear cologne, perfume, aftershave, or lotions (deodorant is allowed). Please arrive 15 minutes prior to your appointment time.  Please note: We ask at that you not bring children with you during ultrasound (echo/ vascular) testing. Due to room size and safety concerns, children are not allowed in the ultrasound rooms during exams. Our front office staff cannot provide observation of children in our lobby area while testing is being conducted. An adult accompanying a patient to their appointment will only be allowed in the ultrasound room at the discretion of the ultrasound technician under special circumstances. We apologize for any inconvenience.   Follow-Up: At [facility], you and your health needs are our priority.  As part of our continuing mission to provide you with exceptional heart care, our providers are all part of one team.  This team includes  your primary Cardiologist (physician) and Advanced Practice Providers or APPs (Physician Assistants and Nurse Practitioners) who all work together to provide you with the care you need, when you need it.  Your next appointment:   1 year(s)  Provider:   Lynwood Schilling, MD    We recommend signing up for the patient portal called MyChart.  Sign up information is provided on this After Visit Summary.  MyChart is used to connect with patients for Virtual Visits (Telemedicine).  Patients are able to view lab/test results, encounter notes, upcoming appointments, etc.  Non-urgent messages can be sent to your provider as well.   To learn more about what you can do with MyChart, go to [email or web address].

## 2024-12-24 ENCOUNTER — Telehealth: Payer: Self-pay | Admitting: Cardiology

## 2024-12-24 ENCOUNTER — Other Ambulatory Visit: Payer: Self-pay

## 2024-12-24 NOTE — Telephone Encounter (Signed)
" °*  STAT* If patient is at the pharmacy, call can be transferred to refill team.   1. Which medications need to be refilled? (please list name of each medication and dose if known) amLODipine  (NORVASC ) 2.5 MG tablet    2. Would you like to learn more about the convenience, safety, & potential cost savings by using the [facility]?No   3. Are you open to using the [facility] (Type [facility] No    4. Which pharmacy/location (including street and city if local pharmacy) is medication to be sent to?[facility] - [place], [place] - [address]    5. Do they need a 30 day or 90 day supply? 30  Pt is out and goes out of town in the AM, please advise.    "

## 2024-12-25 MED ORDER — AMLODIPINE BESYLATE 2.5 MG PO TABS: 2.5000 mg | ORAL_TABLET | Freq: Every day | ORAL | 3 refills | Status: AC

## 2024-12-30 ENCOUNTER — Other Ambulatory Visit: Payer: Self-pay

## 2025-01-02 ENCOUNTER — Ambulatory Visit (HOSPITAL_BASED_OUTPATIENT_CLINIC_OR_DEPARTMENT_OTHER): Admitting: Radiology

## 2025-01-02 ENCOUNTER — Ambulatory Visit (HOSPITAL_BASED_OUTPATIENT_CLINIC_OR_DEPARTMENT_OTHER)
Admission: RE | Admit: 2025-01-02 | Discharge: 2025-01-02 | Disposition: A | Discharge status: Home / Self Care | Payer: Self-pay | Source: Ambulatory Visit | Attending: Family Medicine | Admitting: Family Medicine

## 2025-01-02 ENCOUNTER — Encounter (HOSPITAL_BASED_OUTPATIENT_CLINIC_OR_DEPARTMENT_OTHER): Payer: Self-pay

## 2025-01-02 ENCOUNTER — Ambulatory Visit (HOSPITAL_BASED_OUTPATIENT_CLINIC_OR_DEPARTMENT_OTHER): Payer: Self-pay | Admitting: Family Medicine

## 2025-01-02 VITALS — BP 125/69 | HR 69 | Temp 97.6°F | Resp 18

## 2025-01-02 DIAGNOSIS — R1032 Left lower quadrant pain: Secondary | ICD-10-CM | POA: Diagnosis not present

## 2025-01-02 DIAGNOSIS — K5900 Constipation, unspecified: Secondary | ICD-10-CM

## 2025-01-02 DIAGNOSIS — M545 Low back pain, unspecified: Secondary | ICD-10-CM

## 2025-01-02 DIAGNOSIS — R1031 Right lower quadrant pain: Secondary | ICD-10-CM

## 2025-01-02 DIAGNOSIS — M5442 Lumbago with sciatica, left side: Secondary | ICD-10-CM

## 2025-01-02 DIAGNOSIS — R109 Unspecified abdominal pain: Secondary | ICD-10-CM

## 2025-01-02 DIAGNOSIS — R1024 Suprapubic pain: Secondary | ICD-10-CM

## 2025-01-02 DIAGNOSIS — R400 Somnolence: Secondary | ICD-10-CM

## 2025-01-02 DIAGNOSIS — R140 Abdominal distension (gaseous): Secondary | ICD-10-CM | POA: Diagnosis not present

## 2025-01-02 LAB — POCT URINE DIPSTICK
Bilirubin, UA: NEGATIVE
Blood, UA: NEGATIVE
Glucose, UA: NEGATIVE mg/dL
Ketones, POC UA: NEGATIVE mg/dL
Leukocytes, UA: NEGATIVE
Nitrite, UA: NEGATIVE
Protein Ur, POC: NEGATIVE mg/dL
Spec Grav, UA: 1.015
Urobilinogen, UA: 0.2 U/dL
pH, UA: 6.5

## 2025-01-02 MED ORDER — PREDNISONE 20 MG PO TABS: ORAL_TABLET | ORAL | 0 refills | Status: AC

## 2025-01-02 NOTE — Discharge Instructions (Addendum)
 Lower back pain radiating to the abdomen with lower abdominal pain, constipation, bloating: Urinalysis was totally normal with no signs of UTI.  Abdominal x-ray shows a large amount of stool in the right ascending colon and a large amount of gas in the left descending colon.  Patient reports she does have IBS and her last bowel movement was over 2 days ago.  She has Linzess at home and she will try that to get her bowels moving.  Due to recent injections (a total of 60 mg of Kenalog on 12/24/2024), we will choose prednisone 20 mg, #2 pills (40 mg dose) daily x 3 days, then take 20 mg, #1 pill (20 mg dose) daily x 3 days.  Do not use Advil, Motrin, Ibuprofen, Aleve, Naproxen Sodium (NSAIDS) for 1 to 2 weeks during/after using oral steroids or after a steroid injection.  May use Tylenol /Acetaminophen , 500 mg,1-1.5 pills, every 6 hours as needed for pain.   Encouraged to continue her physical therapy and contact her orthopedic surgeon regarding her back pain.  Drowsiness: Exam was normal.  I could not find a cause of the drowsiness.  She has been doing a lot of traveling and just got off of a flight within the last 48 hours.  Encouraged rest.  Follow-up with primary care if symptoms persist.

## 2025-01-02 NOTE — ED Provider Notes (Signed)
 " [name]    CSN: [id number] Arrival date & time: 01/02/25  1120      History   Chief Complaint Chief Complaint  Patient presents with   Back Pain    I feel I have strained or pulled a muscle in my lower back.  Horrible pain and nothing is helping - Entered by patient    HPI [name] is a 74 y.o. female.   74 year old female with acute left lower back pain that radiates around to her left lower pelvis.  She denies any specific known injury.  She has chronic lower back pain and has SI joint injections under fluoroscopy 3 times a year.  She had a set of injections (Right SI Joint and Left SI Joint) on 12/24/2024 with 30 mg of Kenalog injected bilaterally for a total of 60 mg of Kenalog.  Prior to 12/24/2024, her last injection was 08/06/2024.  She did visit [place] .  She flew there and flew home.  She has a 19-year-old grandchild that she was picking up and carrying a lot.  She does not remember any injury or strain while doing all of that.  She also has had a total right knee replacement and still has chronic right knee pain.  She has been in physical therapy for the right knee.  The back pain started on approximately 12/31/2024 or earlier and a flare up of her right knee pain also started on the same date.  She denies any burning, frequency, urgency of urination.  She denies a fever, nausea, vomiting, constipation, diarrhea.  She drinks 120 ounces of fluids daily and does not feel like she is dehydrated.  She did have a lot of ear pressure and mild nasal congestion on the flight home from [place] .   Back Pain Associated symptoms: abdominal pain   Associated symptoms: no chest pain, no dysuria and no fever     Past Medical History:  Diagnosis Date   Arthritis    lower back   Carpal tunnel syndrome of right wrist    Complication of anesthesia    felt funny afterward, sleep did help some   Dysrhythmia    rapid heart rate at night   Headache(784.0)    migraines    Hepatitis 12/11/1974   hep A   Palpitation     Patient Active Problem List   Diagnosis Date Noted   Chronic constipation 11/21/2023   Peripheral vertigo 08/27/2023   Essential hypertension 08/12/2023   Elevated coronary artery calcium score 08/12/2023   Anxiety 06/11/2023   Dysphagia 06/11/2023   Irritable bowel syndrome 06/11/2023   Migraine 06/11/2023   COVID-19 virus infection 01/20/2021   ETD (Eustachian tube dysfunction), bilateral 01/10/2021   Primary osteoarthritis of right knee 04/14/2020   Primary osteoarthritis of left knee 04/14/2020   Chronic daily headache 02/22/2018   Primary insomnia 02/22/2018   Seasonal allergies 02/22/2018   Aortic valve sclerosis 09/06/2016   Cough 09/11/2013   Multiple pulmonary nodules assoc with bronchiectasis 09/11/2013   Chronic interstitial cystitis 01/01/2013   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   NASAL SINUS SURGERY     PARTIAL HYSTERECTOMY      OB History   No obstetric history on file.      Home Medications    Prior to Admission medications  Medication Sig Start Date End Date Taking? Authorizing Provider  ALPRAZolam (XANAX) 1 MG tablet Take 0.5-1 mg by mouth 2 (two) times daily as needed.   Yes [provider]  amLODipine  (NORVASC ) 2.5 MG tablet Take 1 tablet (2.5 mg total) by mouth daily. 12/25/24  Yes Lavona Agent, MD  atenolol  (TENORMIN ) 25 MG tablet Take 1 tablet (25 mg  total) by mouth daily. 05/02/22  Yes Lavona Agent, MD  butalbital-acetaminophen -caffeine (FIORICET, ESGIC) 50-325-40 MG per tablet Take 1 tablet by mouth every 4 (four) hours as needed for headache or migraine.   Yes [provider]  predniSONE (DELTASONE) 20 MG tablet Take 20 mg, #2 pills (40 mg dose) daily x 3 days, then take 20 mg, #1 pill (20 mg dose) daily x 3 days. 01/02/25  Yes Ival Domino, FNP  traMADol  (ULTRAM ) 50 MG tablet TAKE 1 TABLET BY MOUTH EVERY 6 HOURS AS NEEDED FOR SEVERE PAIN( MAX OF 20 TABS PER MONTH) 04/29/19  Yes Darlean Ozell NOVAK, MD  traZODone (DESYREL) 100 MG tablet Take 50-100 mg by mouth at bedtime. 12/10/23  Yes [provider]  albuterol  (PROVENTIL ) (2.5 MG/3ML) 0.083% nebulizer solution Take 3 mLs (2.5 mg total) by nebulization every 4 (four) hours as needed. 12/18/23   Darlean Ozell NOVAK, MD  albuterol  (VENTOLIN  HFA) 108 [phone] Base) MCG/ACT inhaler  12/17/20   [provider]  Ascorbic Acid (VITAMIN C) 1000 MG tablet Take 1,000 mg by mouth daily.    [provider]  Cholecalciferol ([employer] VIT D 5000 HIGH-POTENCY PO) Take by mouth.    [provider]  erythromycin  ophthalmic ointment Place a 1/2 inch ribbon of ointment into the lower eyelid 3-4 times a day 11/14/24   Adah Corning A, FNP  Estradiol 1 MG/GM GEL Place 1 application onto the skin every morning.    [provider]  famotidine  (PEPCID ) 20 MG tablet One after supper 05/02/22   Darlean Ozell NOVAK, MD  lidocaine  (LIDODERM ) 5 % Remove & Discard patch within 12 hours or as directed by MD 03/19/24   Hutchinson Cordella Glendia, MD  LINZESS 72 MCG capsule Take 72 mcg by mouth every morning.    [provider]  mometasone -formoterol  (DULERA) 100-5 MCG/ACT AERO Take 2 puffs first thing in am and then another 2 puffs about 12 hours later. 12/18/23   Darlean Ozell NOVAK, MD  NONFORMULARY OR COMPOUNDED ITEM Apply 1 application topically at bedtime. Progesterone    [provider]   pantoprazole  (PROTONIX ) 40 MG tablet Take 1 tablet (40 mg total) by mouth daily. Take 30-60 min before first meal of the day 12/18/23   Darlean Ozell NOVAK, MD  therapeutic multivitamin-minerals [facility]) tablet Take 1 tablet by mouth daily.    [provider]  UNABLE TO FIND Med Name: Omega 3    [provider]  valACYclovir (VALTREX) 500 MG tablet Take 500 mg by mouth daily.    [provider]  zinc gluconate 50 MG tablet Take 50 mg by mouth daily.    [provider]    Family History Family History  Problem Relation Age of Onset   Kidney failure Mother    Lung cancer Father        smoked   Emphysema Father        smoked    Social History Social History[1]   Allergies   Sulfamethoxazole-trimethoprim and Diflucan [fluconazole]   Review of Systems Review of Systems  Constitutional:  Negative for chills and fever.  HENT:  Positive for congestion, ear pain, postnasal drip and rhinorrhea. Negative for sore throat.   Eyes:  Negative for pain and visual disturbance.  Respiratory:  Negative for cough and shortness of breath.   Cardiovascular:  Negative for chest pain and palpitations.  Gastrointestinal:  Positive for abdominal pain. Negative for constipation, diarrhea, nausea and vomiting.  Genitourinary:  Negative for dysuria, frequency, hematuria and urgency.  Musculoskeletal:  Positive for back pain. Negative for arthralgias.  Skin:  Negative for color change and rash.  Neurological:  Negative for seizures and syncope.  All other systems reviewed and are negative.    Physical Exam Triage Vital Signs ED Triage Vitals  Encounter Vitals Group     BP 01/02/25 1135 125/69     Girls Systolic BP Percentile --      Girls Diastolic BP Percentile --      Boys Systolic BP Percentile --      Boys Diastolic BP Percentile --      Pulse Rate 01/02/25 1135 69     Resp 01/02/25 1135 18     Temp 01/02/25 1135 97.6 F (36.4 C)     Temp Source  01/02/25 1135 Oral     SpO2 01/02/25 1135 96 %     Weight --      Height --      Head Circumference --      Peak Flow --      Pain Score 01/02/25 1133 8     Pain Loc --      Pain Education --      Exclude from Growth Chart --    No data found.  Updated Vital Signs BP 125/69 (BP Location: Right Arm)   Pulse 69   Temp 97.6 F (36.4 C) (Oral)   Resp 18   SpO2 96%   Visual Acuity Right Eye Distance:   Left Eye Distance:   Bilateral Distance:    Right Eye Near:   Left Eye Near:    Bilateral Near:     Physical Exam Vitals and nursing note reviewed.  Constitutional:      General: She is not in acute distress.    Appearance: She is well-developed. She is not ill-appearing or toxic-appearing.  HENT:     Head: Normocephalic and atraumatic.     Right Ear: Hearing, tympanic membrane, ear canal and external ear normal.     Left Ear: Hearing, tympanic membrane, ear canal and external ear normal.     Nose: No congestion or rhinorrhea.     Right Sinus: No maxillary sinus tenderness or frontal sinus tenderness.     Left Sinus: No maxillary sinus tenderness or frontal sinus tenderness.     Mouth/Throat:     Lips: Pink.     Mouth: Mucous membranes are moist.     Pharynx: Uvula midline. No oropharyngeal exudate or posterior oropharyngeal erythema.     Tonsils: No tonsillar exudate.  Eyes:     Conjunctiva/sclera: Conjunctivae normal.     Pupils: Pupils are equal, round, and  reactive to light.  Cardiovascular:     Rate and Rhythm: Normal rate and regular rhythm.     Pulses:          Posterior tibial pulses are 2+ on the right side and 2+ on the left side.     Heart sounds: S1 normal and S2 normal. No murmur heard. Pulmonary:     Effort: Pulmonary effort is normal. No respiratory distress.     Breath sounds: Normal breath sounds. No decreased breath sounds, wheezing, rhonchi or rales.  Abdominal:     General: Bowel sounds are normal.     Palpations: Abdomen is soft.      Tenderness: There is abdominal tenderness (Abdominal pain and left CVA tenderness are all mild.) in the right lower quadrant, suprapubic area and left lower quadrant. There is left CVA tenderness. There is no right CVA tenderness, guarding or rebound. Negative signs include Murphy's sign, Rovsing's sign and McBurney's sign.  Musculoskeletal:        General: No swelling.     Cervical back: Normal and neck supple.     Thoracic back: Normal.     Lumbar back: Spasms (Left lower back above the buttock and above the hip), tenderness and bony tenderness present. No swelling, edema, deformity, signs of trauma or lacerations. Normal range of motion. No scoliosis.     Right hip: Normal.     Left hip: Normal.     Right upper leg: Normal.     Left upper leg: Normal.     Right knee: Swelling (Minimal swelling bilaterally below the patella) and bony tenderness present. No deformity, effusion, erythema, ecchymosis, lacerations or crepitus. Normal range of motion. Tenderness present over the medial joint line and lateral joint line. No LCL laxity, MCL laxity, ACL laxity or PCL laxity. Normal alignment, normal meniscus and normal patellar mobility. Normal pulse.     Left knee: Normal.     Right lower leg: Normal.     Left lower leg: Normal.     Right ankle: Normal.     Left ankle: Normal.     Right foot: Normal.     Left foot: Normal.  Lymphadenopathy:     Head:     Right side of head: No submental, submandibular, tonsillar, preauricular or posterior auricular adenopathy.     Left side of head: No submental, submandibular, tonsillar, preauricular or posterior auricular adenopathy.     Cervical: No cervical adenopathy.     Right cervical: No superficial cervical adenopathy.    Left cervical: No superficial cervical adenopathy.  Skin:    General: Skin is warm and dry.     Capillary Refill: Capillary refill takes less than 2 seconds.     Findings: No rash.  Neurological:     Mental Status: She is alert and  oriented to person, place, and time.  Psychiatric:        Mood and Affect: Mood normal.      UC Treatments / Results  Labs (all labs ordered are listed, but only abnormal results are displayed) COMPLETE METABOLIC PANEL WITH GFR: 05/21/23:     Component Ref Range & Units (hover) 1 yr ago (05/21/23)  Glucose, Bld 92  Comment: .            Fasting reference interval .  BUN 11  Creat 0.61  eGFR 97  BUN/Creatinine Ratio SEE NOTE:  Comment:    Not Reported: BUN and Creatinine are within    reference range. .  Sodium  133 Low   Potassium 4.0  Chloride 97 Low   CO2 28  Calcium 8.9  Total Protein 6.5  Albumin 4.2  Globulin 2.3  AG Ratio 1.8  Total Bilirubin 0.3  Alkaline phosphatase (APISO) 69  AST 22  ALT 25  Resulting Agency [facility]      EKG   Radiology No results found.  Procedures Procedures (including critical care time)  Medications Ordered in UC Medications - No data to display  Initial Impression / Assessment and Plan / UC Course  I have reviewed the triage vital signs and the nursing notes.  Pertinent labs & imaging results that were available during my care of the patient were reviewed by me and considered in my medical decision making (see chart for details).  Plan of Care (see discharge instructions for additional patient precautions and education): Lower back pain radiating to the abdomen with lower abdominal pain, constipation, bloating: Urinalysis was totally normal with no signs of UTI.  Abdominal x-ray shows a large amount of stool in the right ascending colon and a large amount of gas in the left descending colon.  Patient reports she does have IBS and her last bowel movement was over 2 days ago.  She has Linzess at home and she will try that to get her bowels moving.  Due to recent injections (a total of 60 mg of Kenalog on 12/24/2024), we will choose prednisone 20 mg, #2 pills (40 mg dose) daily x 3 days, then take 20 mg, #1  pill (20 mg dose) daily x 3 days.  Do not use Advil, Motrin, Ibuprofen, Aleve, Naproxen Sodium (NSAIDS) for 1 to 2 weeks during/after using oral steroids or after a steroid injection.  May use Tylenol /Acetaminophen , 500 mg,1-1.5 pills, every 6 hours as needed for pain.   Encouraged to continue her physical therapy and contact her orthopedic surgeon regarding her back pain.  Drowsiness: Exam was normal.  I could not find a cause of the drowsiness.  She has been doing a lot of traveling and just got off of a flight within the last 48 hours.  Encouraged rest.  Follow-up with primary care if symptoms persist.  I reviewed the plan of care with the patient and/or the patient's guardian.  The patient and/or guardian had time to ask questions and acknowledged that the questions were answered.   I spent over 30 minutes on patient care, including face to face time and care planning/patient management.  Additional time was required to review previous medical records, collect her medical history, review labs and x-rays for workup, independent review of the abdominal x-ray prior to radiology review and discussion with the patient about the plan of care. Final Clinical Impressions(s) / UC Diagnoses   Final diagnoses:  Suprapubic pain  Left lower quadrant abdominal pain  Right lower quadrant abdominal pain  Acute left-sided low back pain with left-sided sciatica  Drowsiness  Constipation, unspecified constipation type  Abdominal bloating     Discharge Instructions      Lower back pain radiating to the abdomen with lower abdominal pain, constipation, bloating: Urinalysis was totally normal with no signs of UTI.  Abdominal x-ray shows a large amount of stool in the right ascending colon and a large amount of gas in the left descending colon.  Patient reports she does have IBS and her last bowel movement was over 2 days ago.  She has Linzess at home and she will try that to get her bowels moving.  Due  to  recent injections (a total of 60 mg of Kenalog on 12/24/2024), we will choose prednisone  20 mg, #2 pills (40 mg dose) daily x 3 days, then take 20 mg, #1 pill (20 mg dose) daily x 3 days.  Do not use Advil, Motrin, Ibuprofen, Aleve, Naproxen Sodium (NSAIDS) for 1 to 2 weeks during/after using oral steroids or after a steroid injection.  May use Tylenol /Acetaminophen , 500 mg,1-1.5 pills, every 6 hours as needed for pain.   Encouraged to continue her physical therapy and contact her orthopedic surgeon regarding her back pain.  Drowsiness: Exam was normal.  I could not find a cause of the drowsiness.  She has been doing a lot of traveling and just got off of a flight within the last 48 hours.  Encouraged rest.  Follow-up with primary care if symptoms persist.     ED Prescriptions     Medication Sig Dispense Auth. Provider   predniSONE  (DELTASONE ) 20 MG tablet Take 20 mg, #2 pills (40 mg dose) daily x 3 days, then take 20 mg, #1 pill (20 mg dose) daily x 3 days. 9 tablet Lou Loewe, FNP      PDMP not reviewed this encounter.    [1]  Social History Tobacco Use   Smoking status: Former    Current packs/day: 0.00    Average packs/day: 0.3 packs/day for 9.0 years (2.3 ttl pk-yrs)    Types: Cigarettes    Start date: 04/14/1969    Quit date: 04/14/1978    Years since quitting: 46.7   Smokeless tobacco: Never  Substance Use Topics   Alcohol use: No   Drug use: No     Ival Domino, FNP 01/02/25 1300  "

## 2025-01-02 NOTE — ED Triage Notes (Signed)
 Pt reports left side back pain she did get injection on Wednesday to her lower back but she recently traveled back from [place]. and she has been visiting her grandchild who is 1 and she was picking her up pt currently in PT for lower quad strengthening.

## 2025-01-02 NOTE — Progress Notes (Signed)
 X-ray shows some mild cardiac enlargement, some calcifications in her vascular vessels both of these are consistent with her age and not acute.  She has some lumbar scoliosis which is also not new.  She has a moderate colonic stool burden on the right and some gas on the left.  No acute abdominal findings.  Patient was given this report during her visit.

## 2025-08-19 ENCOUNTER — Ambulatory Visit (HOSPITAL_COMMUNITY)
